# Patient Record
Sex: Female | Born: 1990 | Race: White | Hispanic: No | Marital: Married | Smoking: Never smoker
Health system: Southern US, Community
[De-identification: ages and names within clinical notes are randomized; demographics above are authoritative.]

## PROBLEM LIST (undated history)

## (undated) DIAGNOSIS — O021 Missed abortion: Secondary | ICD-10-CM

## (undated) DIAGNOSIS — Z9889 Other specified postprocedural states: Secondary | ICD-10-CM

## (undated) DIAGNOSIS — J45909 Unspecified asthma, uncomplicated: Secondary | ICD-10-CM

## (undated) HISTORY — PX: OTHER SURGICAL HISTORY: SHX169

## (undated) HISTORY — DX: Unspecified asthma, uncomplicated: J45.909

---
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
## 2014-05-17 ENCOUNTER — Emergency Department (HOSPITAL_COMMUNITY)
Admission: EM | Admit: 2014-05-17 | Discharge: 2014-05-17 | Disposition: A | Discharge status: Home / Self Care | Payer: 59 | Source: Home / Self Care | Attending: Family Medicine | Admitting: Family Medicine

## 2014-05-17 ENCOUNTER — Encounter (HOSPITAL_COMMUNITY): Payer: Self-pay | Admitting: *Deleted

## 2014-05-17 DIAGNOSIS — K529 Noninfective gastroenteritis and colitis, unspecified: Secondary | ICD-10-CM

## 2014-05-17 LAB — POCT URINALYSIS DIP (DEVICE)
BILIRUBIN URINE: NEGATIVE
GLUCOSE, UA: NEGATIVE mg/dL
Hgb urine dipstick: NEGATIVE
KETONES UR: NEGATIVE mg/dL
Leukocytes, UA: NEGATIVE
Nitrite: NEGATIVE
Protein, ur: NEGATIVE mg/dL
Specific Gravity, Urine: 1.025 (ref 1.005–1.030)
Urobilinogen, UA: 0.2 mg/dL (ref 0.0–1.0)
pH: 6 (ref 5.0–8.0)

## 2014-05-17 LAB — POCT PREGNANCY, URINE: PREG TEST UR: NEGATIVE

## 2014-05-17 MED ORDER — ONDANSETRON HCL 4 MG PO TABS: 4.0000 mg | ORAL_TABLET | Freq: Three times a day (TID) | ORAL | Status: DC | PRN

## 2014-05-17 MED ORDER — KETOROLAC TROMETHAMINE 30 MG/ML IJ SOLN
INTRAMUSCULAR | Status: AC
  Filled 2014-05-17: qty 1

## 2014-05-17 MED ORDER — KETOROLAC TROMETHAMINE 30 MG/ML IJ SOLN
30.0000 mg | Freq: Once | INTRAMUSCULAR | Status: AC
  Administered 2014-05-17: 30 mg via INTRAMUSCULAR

## 2014-05-17 MED ORDER — ONDANSETRON 4 MG PO TBDP
ORAL_TABLET | ORAL | Status: AC
  Filled 2014-05-17: qty 1

## 2014-05-17 MED ORDER — ONDANSETRON 4 MG PO TBDP
4.0000 mg | ORAL_TABLET | Freq: Once | ORAL | Status: AC
  Administered 2014-05-17: 4 mg via ORAL

## 2014-05-17 NOTE — ED Provider Notes (Signed)
CSN: [id number]     Arrival date & time 05/17/14  1306 History   First MD Initiated Contact with Patient 05/17/14 1436     Chief Complaint  Patient presents with  . Emesis   (Consider location/radiation/quality/duration/timing/severity/associated sxs/prior Treatment) HPI Comments: Woke with N/V/D, cramping abdominal pain and chills this morning. Boyfriend ill with same LNMP: 04/14/2014 O/W healthy Works in healthcare  Patient is a 23 y.o. female presenting with GI illness. The history is provided by the patient.  GI Problem This is a new problem. The current episode started 3 to 5 hours ago. Associated symptoms include abdominal pain.    History reviewed. No pertinent past medical history. History reviewed. No pertinent past surgical history. History reviewed. No pertinent family history. History  Substance Use Topics  . Smoking status: Not on file  . Smokeless tobacco: Not on file  . Alcohol Use: Yes     Comment: occasonally    OB History    No data available     Review of Systems  Constitutional: Positive for chills and appetite change.  HENT: Negative.   Respiratory: Negative.   Cardiovascular: Negative.   Gastrointestinal: Positive for nausea, vomiting, abdominal pain and diarrhea. Negative for blood in stool.  Genitourinary: Negative for dysuria, frequency, hematuria, flank pain, vaginal bleeding, vaginal discharge, vaginal pain and menstrual problem.  Musculoskeletal: Positive for back pain.  Skin: Negative.     Allergies  Review of patient's allergies indicates no known allergies.  Home Medications   Prior to Admission medications   Medication Sig Start Date End Date Taking? Authorizing Provider  ondansetron (ZOFRAN) 4 MG tablet Take 1 tablet (4 mg total) by mouth every 8 (eight) hours as needed for nausea or vomiting. 05/17/14   Mathis FareJennifer Lee H Johnny Gorter, PA   BP 111/61 mmHg  Pulse 118  Temp(Src) 98.3 F (36.8 C)  Resp 20  SpO2 100%  LMP  04/14/2014 Physical Exam  Constitutional: She is oriented to person, place, and time. She appears well-developed and well-nourished. No distress.  HENT:  Head: Normocephalic and atraumatic.  Mouth/Throat: Oropharynx is clear and moist.  Eyes: Conjunctivae are normal. No scleral icterus.  Cardiovascular: Regular rhythm and normal heart sounds.   +mild tachycardia  Pulmonary/Chest: Effort normal and breath sounds normal.  Abdominal: Soft. Normal appearance. Bowel sounds are increased. There is no tenderness. There is no rigidity, no guarding and no CVA tenderness.  Musculoskeletal: Normal range of motion.  Neurological: She is alert and oriented to person, place, and time.  Skin: Skin is warm and dry. No rash noted. No erythema.  Psychiatric: She has a normal mood and affect. Her behavior is normal.  Nursing note and vitals reviewed.   ED Course  Procedures (including critical care time) Labs Review Labs Reviewed  POCT URINALYSIS DIP (DEVICE)  POCT PREGNANCY, URINE    Imaging Review No results found.   MDM   1. Gastroenteritis   Zofran 4 mg ODT. [facility] Rx for home Oral rehydration  Tylenol or ibuprofen for pain Clears with diet advance as tolerated.  UA normal UPT negative    Ria ClockJennifer Lee H Syra Sirmons, [place] 05/17/14 1452

## 2014-05-17 NOTE — Discharge Instructions (Signed)
Food Choices to Help Relieve Diarrhea °When you have diarrhea, the foods you eat and your eating habits are very important. Choosing the right foods and drinks can help relieve diarrhea. Also, because diarrhea can last up to 7 days, you need to replace lost fluids and electrolytes (such as sodium, potassium, and chloride) in order to help prevent dehydration.  °WHAT GENERAL GUIDELINES DO I NEED TO FOLLOW? °· Slowly drink 1 cup (8 oz) of fluid for each episode of diarrhea. If you are getting enough fluid, your urine will be clear or pale yellow. °· Eat starchy foods. Some good choices include white rice, white toast, pasta, low-fiber cereal, baked potatoes (without the skin), saltine crackers, and bagels. °· Avoid large servings of any cooked vegetables. °· Limit fruit to two servings per day. A serving is ½ cup or 1 small piece. °· Choose foods with less than 2 g of fiber per serving. °· Limit fats to less than 8 tsp (38 g) per day. °· Avoid fried foods. °· Eat foods that have probiotics in them. Probiotics can be found in certain dairy products. °· Avoid foods and beverages that may increase the speed at which food moves through the stomach and intestines (gastrointestinal tract). Things to avoid include: °¨ High-fiber foods, such as dried fruit, raw fruits and vegetables, nuts, seeds, and whole grain foods. °¨ Spicy foods and high-fat foods. °¨ Foods and beverages sweetened with high-fructose corn syrup, honey, or sugar alcohols such as xylitol, sorbitol, and mannitol. °WHAT FOODS ARE RECOMMENDED? °Grains °White rice. White, French, or pita breads (fresh or toasted), including plain rolls, buns, or bagels. White pasta. Saltine, soda, or graham crackers. Pretzels. Low-fiber cereal. Cooked cereals made with water (such as cornmeal, farina, or cream cereals). Plain muffins. Matzo. Melba toast. Zwieback.  °Vegetables °Potatoes (without the skin). Strained tomato and vegetable juices. Most well-cooked and canned  vegetables without seeds. Tender lettuce. °Fruits °Cooked or canned applesauce, apricots, cherries, fruit cocktail, grapefruit, peaches, pears, or plums. Fresh bananas, apples without skin, cherries, grapes, cantaloupe, grapefruit, peaches, oranges, or plums.  °Meat and Other Protein Products °Baked or boiled chicken. Eggs. Tofu. Fish. Seafood. Smooth peanut butter. Ground or well-cooked tender beef, ham, veal, lamb, pork, or poultry.  °Dairy °Plain yogurt, kefir, and unsweetened liquid yogurt. Lactose-free milk, buttermilk, or soy milk. Plain hard cheese. °Beverages °Sport drinks. Clear broths. Diluted fruit juices (except prune). Regular, caffeine-free sodas such as ginger ale. Water. Decaffeinated teas. Oral rehydration solutions. Sugar-free beverages not sweetened with sugar alcohols. °Other °Bouillon, broth, or soups made from recommended foods.  °The items listed above may not be a complete list of recommended foods or beverages. Contact your dietitian for more options. °WHAT FOODS ARE NOT RECOMMENDED? °Grains °Whole grain, whole wheat, bran, or rye breads, rolls, pastas, crackers, and cereals. Wild or brown rice. Cereals that contain more than 2 g of fiber per serving. Corn tortillas or taco shells. Cooked or dry oatmeal. Granola. Popcorn. °Vegetables °Raw vegetables. Cabbage, broccoli, Brussels sprouts, artichokes, baked beans, beet greens, corn, kale, legumes, peas, sweet potatoes, and yams. Potato skins. Cooked spinach and cabbage. °Fruits °Dried fruit, including raisins and dates. Raw fruits. Stewed or dried prunes. Fresh apples with skin, apricots, mangoes, pears, raspberries, and strawberries.  °Meat and Other Protein Products °Chunky peanut butter. Nuts and seeds. Beans and lentils. Bacon.  °Dairy °High-fat cheeses. Milk, chocolate milk, and beverages made with milk, such as milk shakes. Cream. Ice cream. °Sweets and Desserts °Sweet rolls, doughnuts, and sweet breads. Pancakes   and waffles. °Fats and  Oils °Butter. Cream sauces. Margarine. Salad oils. Plain salad dressings. Olives. Avocados.  °Beverages °Caffeinated beverages (such as coffee, tea, soda, or energy drinks). Alcoholic beverages. Fruit juices with pulp. Prune juice. Soft drinks sweetened with high-fructose corn syrup or sugar alcohols. °Other °Coconut. Hot sauce. Chili powder. Mayonnaise. Gravy. Cream-based or milk-based soups.  °The items listed above may not be a complete list of foods and beverages to avoid. Contact your dietitian for more information. °WHAT SHOULD I DO IF I BECOME DEHYDRATED? °Diarrhea can sometimes lead to dehydration. Signs of dehydration include dark urine and dry mouth and skin. If you think you are dehydrated, you should rehydrate with an oral rehydration solution. These solutions can be purchased at pharmacies, retail stores, or online.  °Drink ½-1 cup (120-240 mL) of oral rehydration solution each time you have an episode of diarrhea. If drinking this amount makes your diarrhea worse, try drinking smaller amounts more often. For example, drink 1-3 tsp (5-15 mL) every 5-10 minutes.  °A general rule for staying hydrated is to drink 1½-2 L of fluid per day. Talk to your health care provider about the specific amount you should be drinking each day. Drink enough fluids to keep your urine clear or pale yellow. °Document Released: 09/07/2003 Document Revised: 06/22/2013 Document Reviewed: 05/10/2013 °ExitCare® Patient Information ©2015 [employer]. This information is not intended to replace advice given to you by your health care provider. Make sure you discuss any questions you have with your health care provider. °Viral Gastroenteritis °Viral gastroenteritis is also known as stomach flu. This condition affects the stomach and intestinal tract. It can cause sudden diarrhea and vomiting. The illness typically lasts 3 to 8 days. Most people develop an immune response that eventually gets rid of the virus. While this natural  response develops, the virus can make you quite ill. °CAUSES  °Many different viruses can cause gastroenteritis, such as rotavirus or noroviruses. You can catch one of these viruses by consuming contaminated food or water. You may also catch a virus by sharing utensils or other personal items with an infected person or by touching a contaminated surface. °SYMPTOMS  °The most common symptoms are diarrhea and vomiting. These problems can cause a severe loss of body fluids (dehydration) and a body salt (electrolyte) imbalance. Other symptoms may include: °· Fever. °· Headache. °· Fatigue. °· Abdominal pain. °DIAGNOSIS  °Your caregiver can usually diagnose viral gastroenteritis based on your symptoms and a physical exam. A stool sample may also be taken to test for the presence of viruses or other infections. °TREATMENT  °This illness typically goes away on its own. Treatments are aimed at rehydration. The most serious cases of viral gastroenteritis involve vomiting so severely that you are not able to keep fluids down. In these cases, fluids must be given through an intravenous line (IV). °HOME CARE INSTRUCTIONS  °· Drink enough fluids to keep your urine clear or pale yellow. Drink small amounts of fluids frequently and increase the amounts as tolerated. °· Ask your caregiver for specific rehydration instructions. °· Avoid: °¨ Foods high in sugar. °¨ Alcohol. °¨ Carbonated drinks. °¨ Tobacco. °¨ Juice. °¨ Caffeine drinks. °¨ Extremely hot or cold fluids. °¨ Fatty, greasy foods. °¨ Too much intake of anything at one time. °¨ Dairy products until 24 to 48 hours after diarrhea stops. °· You may consume probiotics. Probiotics are active cultures of beneficial bacteria. They may lessen the amount and number of diarrheal stools in adults. Probiotics   can be found in yogurt with active cultures and in supplements. °· Wash your hands well to avoid spreading the virus. °· Only take over-the-counter or prescription medicines for  pain, discomfort, or fever as directed by your caregiver. Do not give aspirin to children. Antidiarrheal medicines are not recommended. °· Ask your caregiver if you should continue to take your regular prescribed and over-the-counter medicines. °· Keep all follow-up appointments as directed by your caregiver. °SEEK IMMEDIATE MEDICAL CARE IF:  °· You are unable to keep fluids down. °· You do not urinate at least once every 6 to 8 hours. °· You develop shortness of breath. °· You notice blood in your stool or vomit. This may look like coffee grounds. °· You have abdominal pain that increases or is concentrated in one small area (localized). °· You have persistent vomiting or diarrhea. °· You have a fever. °· The patient is a child younger than 3 months, and he or she has a fever. °· The patient is a child older than 3 months, and he or she has a fever and persistent symptoms. °· The patient is a child older than 3 months, and he or she has a fever and symptoms suddenly get worse. °· The patient is a baby, and he or she has no tears when crying. °MAKE SURE YOU:  °· Understand these instructions. °· Will watch your condition. °· Will get help right away if you are not doing well or get worse. °Document Released: 06/17/2005 Document Revised: 09/09/2011 Document Reviewed: 04/03/2011 °ExitCare® Patient Information ©2015 [employer]. This information is not intended to replace advice given to you by your health care provider. Make sure you discuss any questions you have with your health care provider. ° °

## 2014-05-17 NOTE — ED Notes (Signed)
Pt  Reports  Symptoms  Of  Nausea   /  Vomiting  /  Diarrhea     -  Pt    Reports   Symptoms       Began this  Am      Pt  Also  Reports  Boyfriend  Has  Symptoms  Of  A  Bacterial  Infection      Of  Her  Gi  Track

## 2014-07-21 ENCOUNTER — Encounter: Payer: Self-pay | Admitting: Family

## 2014-07-26 ENCOUNTER — Encounter: Payer: Self-pay | Admitting: Family

## 2014-07-26 ENCOUNTER — Ambulatory Visit (INDEPENDENT_AMBULATORY_CARE_PROVIDER_SITE_OTHER): Payer: 59 | Admitting: Family

## 2014-07-26 VITALS — BP 90/60 | HR 66 | Temp 98.5°F | Ht 64.0 in | Wt 113.4 lb

## 2014-07-26 DIAGNOSIS — Z0000 Encounter for general adult medical examination without abnormal findings: Secondary | ICD-10-CM

## 2014-07-26 NOTE — Patient Instructions (Signed)

## 2014-07-26 NOTE — Progress Notes (Signed)
   Subjective:    Patient ID: Theresa Carlson, female    DOB: 12/25/1990, 24 y.o.   MRN: [id number]  HPI 24 year old white female, nonsmoker is in today for CPX and to be established. Denies any concerns. Sees GYN for female care.    Review of Systems  Constitutional: Negative.   HENT: Negative.   Eyes: Negative.   Respiratory: Negative.   Cardiovascular: Negative.   Gastrointestinal: Negative.   Endocrine: Negative.   Genitourinary: Negative.   Musculoskeletal: Negative.   Skin: Negative.   Allergic/Immunologic: Negative.   Neurological: Negative.   Hematological: Negative.   Psychiatric/Behavioral: Negative.    Past Medical History  Diagnosis Date  . Asthma     History   Social History  . Marital Status: Single    Spouse Name: N/A    Number of Children: N/A  . Years of Education: N/A   Occupational History  . Not on file.   Social History Main Topics  . Smoking status: Never Smoker   . Smokeless tobacco: Not on file  . Alcohol Use: 0.0 oz/week    0 Not specified per week     Comment: occasonally   . Drug Use: No  . Sexual Activity: Not on file   Other Topics Concern  . Not on file   Social History Narrative    History reviewed. No pertinent past surgical history.  Family History  Problem Relation Age of Onset  . Hypertension Mother   . Hypertension Father   . Stroke Maternal Grandfather   . Prostate cancer Maternal Grandfather   . Breast cancer Paternal Grandmother   . Lung cancer Paternal Grandfather     No Known Allergies  No current outpatient prescriptions on file prior to visit.   No current facility-administered medications on file prior to visit.    BP 90/60 mmHg  Pulse 66  Temp(Src) 98.5 F (36.9 C) (Oral)  Ht 5\' 4"  (1.626 m)  Wt 113 lb 6.4 oz (51.438 kg)  BMI 19.46 kg/m2chart    Objective:   Physical Exam  Constitutional: She is oriented to person, place, and time. She appears well-developed and well-nourished.  HENT:    Head: Normocephalic.  Right Ear: External ear normal.  Left Ear: External ear normal.  Nose: Nose normal.  Mouth/Throat: Oropharynx is clear and moist.  Eyes: Conjunctivae and EOM are normal. Pupils are equal, round, and reactive to light.  Neck: Normal range of motion. Neck supple. No thyromegaly present.  Cardiovascular: Normal rate, regular rhythm and normal heart sounds.   Pulmonary/Chest: Effort normal and breath sounds normal.  Abdominal: Soft. Bowel sounds are normal.  Musculoskeletal: Normal range of motion. She exhibits no edema or tenderness.  Neurological: She is alert and oriented to person, place, and time. She has normal reflexes.  Skin: Skin is warm and dry.  Psychiatric: She has a normal mood and affect.          Assessment & Plan:  Theresa Carlson was seen today for establish care.  Diagnoses and associated orders for this visit:  Preventative health care - Lipid Panel; Future - CBC with Differential; Future - CMP; Future - POCT urinalysis dipstick - TSH; Future    Encouraged healthy diet and exercise. Monthly self breast exams. Follow with gynecology as scheduled.

## 2014-07-26 NOTE — Progress Notes (Signed)
Pre visit review using our clinic review tool, if applicable. No additional management support is needed unless otherwise documented below in the visit note. 

## 2014-07-28 ENCOUNTER — Other Ambulatory Visit (INDEPENDENT_AMBULATORY_CARE_PROVIDER_SITE_OTHER): Payer: 59

## 2014-07-28 DIAGNOSIS — Z0000 Encounter for general adult medical examination without abnormal findings: Secondary | ICD-10-CM

## 2014-07-28 LAB — CBC WITH DIFFERENTIAL/PLATELET
Basophils Absolute: 0 10*3/uL (ref 0.0–0.1)
Basophils Relative: 0.5 % (ref 0.0–3.0)
EOS ABS: 0.2 10*3/uL (ref 0.0–0.7)
EOS PCT: 3.4 % (ref 0.0–5.0)
HCT: 40.6 % (ref 36.0–46.0)
Hemoglobin: 13.9 g/dL (ref 12.0–15.0)
Lymphocytes Relative: 47.5 % — ABNORMAL HIGH (ref 12.0–46.0)
Lymphs Abs: 2.9 10*3/uL (ref 0.7–4.0)
MCHC: 34.2 g/dL (ref 30.0–36.0)
MCV: 87.3 fl (ref 78.0–100.0)
MONOS PCT: 7.9 % (ref 3.0–12.0)
Monocytes Absolute: 0.5 10*3/uL (ref 0.1–1.0)
NEUTROS PCT: 40.7 % — AB (ref 43.0–77.0)
Neutro Abs: 2.5 10*3/uL (ref 1.4–7.7)
PLATELETS: 206 10*3/uL (ref 150.0–400.0)
RBC: 4.65 Mil/uL (ref 3.87–5.11)
RDW: 12.6 % (ref 11.5–15.5)
WBC: 6.1 10*3/uL (ref 4.0–10.5)

## 2014-07-28 LAB — COMPREHENSIVE METABOLIC PANEL
ALT: 16 U/L (ref 0–35)
AST: 22 U/L (ref 0–37)
Albumin: 4.4 g/dL (ref 3.5–5.2)
Alkaline Phosphatase: 52 U/L (ref 39–117)
BILIRUBIN TOTAL: 0.4 mg/dL (ref 0.2–1.2)
BUN: 15 mg/dL (ref 6–23)
CO2: 24 mEq/L (ref 19–32)
Calcium: 9.8 mg/dL (ref 8.4–10.5)
Chloride: 105 mEq/L (ref 96–112)
Creatinine, Ser: 0.68 mg/dL (ref 0.40–1.20)
GFR: 114.15 mL/min (ref >60.00)
Glucose, Bld: 82 mg/dL (ref 70–99)
Potassium: 4 mEq/L (ref 3.5–5.1)
Sodium: 141 mEq/L (ref 135–145)
TOTAL PROTEIN: 7.2 g/dL (ref 6.0–8.3)

## 2014-07-28 LAB — TSH: TSH: 2.26 u[IU]/mL (ref 0.35–4.50)

## 2014-07-28 LAB — LIPID PANEL
CHOL/HDL RATIO: 2
Cholesterol: 173 mg/dL (ref 0–200)
HDL: 85.6 mg/dL (ref >39.00)
LDL Cholesterol: 75 mg/dL (ref 0–99)
NONHDL: 87.4
Triglycerides: 62 mg/dL (ref 0.0–149.0)
VLDL: 12.4 mg/dL (ref 0.0–40.0)

## 2015-04-12 ENCOUNTER — Ambulatory Visit (INDEPENDENT_AMBULATORY_CARE_PROVIDER_SITE_OTHER): Payer: 59 | Admitting: Allergy and Immunology

## 2015-04-12 ENCOUNTER — Encounter: Payer: Self-pay | Admitting: Allergy and Immunology

## 2015-04-12 VITALS — BP 118/82 | HR 68 | Temp 98.3°F | Resp 18 | Ht 64.76 in | Wt 117.7 lb

## 2015-04-12 DIAGNOSIS — J4520 Mild intermittent asthma, uncomplicated: Secondary | ICD-10-CM | POA: Diagnosis not present

## 2015-04-12 DIAGNOSIS — J3089 Other allergic rhinitis: Secondary | ICD-10-CM

## 2015-04-12 NOTE — Patient Instructions (Addendum)
  1. Allergen avoidance measures  2. Treat and prevent inflammation: OTC Rhinocort - one spray each nostril 3-7 times per week. Takes days to work  3. If needed;   A. Nasal saline  B. otc antihistamine - decongestant  C. ProAir HFA 2 puffs every 4-6 hours. Can use prior to exercise.  4. If medical failure can consider immunotherapy  5. Contact the clinic if problems.

## 2015-04-12 NOTE — Progress Notes (Signed)
[facility]    NEW PATIENT NOTE    Subjective:   Patient ID: Theresa Carlson is a 24 y.o. female with a chief complaint of New Patient (Initial Visit) and Allergy Testing  and the following problems:  HPI Comments:  Theresa Carlson present today with complaints of long standing rhinitis with occasional eye symptoms that occurs on a random and perennial basis without obvious trigger except for cat exposure, that Theresa Carlson treats with Zyrtec-D with relatively good results. Theresa Carlson also had a history of childhood asthma that resolved during elementary school. However, on  Theresa Carlson has not used a SABA in a prolonged period of timea rare occasion Theresa Carlson will develop some wheezing. This appears to occur with exercising in the cold.   Past Medical History  Diagnosis Date  . Asthma     as a child    Past Surgical History  Procedure Laterality Date  . Trigger thumb       Current outpatient prescriptions:  .  Norgestimate-Eth Estradiol (PREVIFEM PO), Take 1 tablet by mouth., Disp: , Rfl:   No orders of the defined types were placed in this encounter.    No Known Allergies  Review of Systems  Constitutional: Negative.   HENT: Positive for congestion.   Eyes: Negative.   Respiratory: Negative.   Cardiovascular: Negative.   Gastrointestinal: Negative.   Musculoskeletal: Negative.   Skin: Negative.     Family History  Problem Relation Age of Onset  . Hypertension Mother   . Eczema Mother   . Hypertension Father   . Stroke Maternal Grandfather   . Prostate cancer Maternal Grandfather   . Breast cancer Paternal Grandmother   . Lung cancer Paternal Grandfather     Social History   Social History  . Marital Status: Single    Spouse Name: N/A  . Number of Children: N/A  . Years of Education: N/A   Occupational History  . Not on file.   Social History Main Topics  . Smoking status: Never Smoker   . Smokeless tobacco: Not on file  .  Alcohol Use: 0.6 oz/week    0 Standard drinks or equivalent, 1 Glasses of wine per week     Comment: occasonally   . Drug Use: No  . Sexual Activity: Not on file   Other Topics Concern  . Not on file   Social History Narrative    Environmental and Social history Lives in a apartment with a dry environment. There are dogs located in the bedroom. There are dogs located in the house. The bedroom floor is carpet. There is plastic covers on the bedding. There no active smoking within the house. There no active smoking within the car. Employment Product managerincludes nurse. Hobbies include hospital chemical exposure.   Objective:   Filed Vitals:   04/12/15 0840  BP: 118/82  Pulse: 68  Temp: 98.3 F (36.8 C)  Resp: 18    Physical Exam  Constitutional: Theresa Carlson is well-developed, well-nourished, and in no distress. No distress.  HENT:  Head: Normocephalic.  Right Ear: External ear normal.  Left Ear: External ear normal.  Nose: Nose normal.  Mouth/Throat: Oropharynx is clear and moist. No oropharyngeal exudate.  Eyes: Conjunctivae are normal. Pupils are equal, round, and reactive to light. Right eye exhibits no discharge. Left eye exhibits no discharge. No scleral icterus.  Neck: No tracheal deviation present. No thyromegaly present.  Cardiovascular: Normal rate, regular rhythm and normal heart sounds.  Exam reveals no gallop and no friction rub.   No murmur heard. Pulmonary/Chest: Effort normal and breath sounds normal. No respiratory distress. Theresa Carlson has no wheezes. Theresa Carlson has no rales. Theresa Carlson exhibits no tenderness.  Abdominal: Soft. Theresa Carlson exhibits no distension and no mass. There is no tenderness. There is no rebound and no guarding.  Lymphadenopathy:    Theresa Carlson has no cervical adenopathy.  Neurological: Theresa Carlson is alert. Gait normal.  Skin: No rash noted. Theresa Carlson is not diaphoretic. No erythema. No pallor.  Psychiatric: Mood and affect normal.    Diagnostics:  Allergy skin tests were performed. Epicutaneous  positives included cat, dog, horse, mouse. Intradermal positives included molds  Spirometry was performed and demonstrated an FEV1 of 2.91 @ 90 % of predicted.  The patient had an Asthma Control Test with the following results: ACT Total Score: 24.     Assessment and Plan:    1. Other allergic rhinitis   2. Mild intermittent asthma, uncomplicated        1. Allergen avoidance measures  2. Treat and prevent inflammation: OTC Rhinocort - one spray each nostril 3-7 times per week. Takes days to work  3. If needed;   A. Nasal saline  B. otc antihistamine - decongestant  C. ProAir HFA 2 puffs every 4-6 hours. Can use prior to exercise.  4. If medical failure can consider immunotherapy  5. Contact the clinic if problems.  Theresa Carlson should do well with a combination of allergen avoidance measures and intermittent use of anti-inflammatory agents. If Theresa Carlson fails medical therapy Theresa Carlson would be a candidate for immunotherapy. Her asthma is a minimal issue although i did discuss with her today that Theresa Carlson is at risk of developing a persistent phenotype in the future. At this point Theresa Carlson asked that we do not send a prescription for a SABA     Laurette Schimke, MD [facility]

## 2015-04-18 ENCOUNTER — Encounter: Payer: Self-pay | Admitting: Allergy and Immunology

## 2015-06-23 ENCOUNTER — Encounter: Payer: Self-pay | Admitting: Internal Medicine

## 2015-06-23 ENCOUNTER — Ambulatory Visit (INDEPENDENT_AMBULATORY_CARE_PROVIDER_SITE_OTHER): Payer: 59 | Admitting: Internal Medicine

## 2015-06-23 VITALS — BP 118/78 | HR 69 | Temp 98.3°F | Wt 114.0 lb

## 2015-06-23 DIAGNOSIS — J020 Streptococcal pharyngitis: Secondary | ICD-10-CM

## 2015-06-23 DIAGNOSIS — J069 Acute upper respiratory infection, unspecified: Secondary | ICD-10-CM | POA: Diagnosis not present

## 2015-06-23 LAB — POCT RAPID STREP A (OFFICE): Rapid Strep A Screen: NEGATIVE

## 2015-06-23 MED ORDER — AZITHROMYCIN 250 MG PO TABS: ORAL_TABLET | ORAL | Status: DC

## 2015-06-23 NOTE — Progress Notes (Signed)
Subjective:  Patient ID: Theresa Carlson, female    DOB: 01/19/1991  Age: 24 y.o. MRN: [id number]  CC: No chief complaint on file.   HPI Theresa Carlson presents for ST x 5 d R>L, URI sx's  Outpatient Prescriptions Prior to Visit  Medication Sig Dispense Refill  . Norgestimate-Eth Estradiol (PREVIFEM PO) Take 1 tablet by mouth.     No facility-administered medications prior to visit.    ROS Review of Systems  Constitutional: Negative for chills, activity change, appetite change, fatigue and unexpected weight change.  HENT: Positive for congestion and sore throat. Negative for mouth sores and sinus pressure.   Eyes: Negative for visual disturbance.  Respiratory: Positive for cough. Negative for chest tightness.   Gastrointestinal: Negative for nausea and abdominal pain.  Genitourinary: Negative for frequency, difficulty urinating and vaginal pain.  Musculoskeletal: Negative for back pain and gait problem.  Skin: Negative for pallor and rash.  Neurological: Negative for dizziness, tremors, weakness, numbness and headaches.  Psychiatric/Behavioral: Negative for confusion and sleep disturbance.    Objective:  BP 118/78 mmHg  Pulse 69  Temp(Src) 98.3 F (36.8 C) (Oral)  Wt 114 lb (51.71 kg)  SpO2 97%  BP Readings from Last 3 Encounters:  06/23/15 118/78  04/12/15 118/82  07/26/14 90/60    Wt Readings from Last 3 Encounters:  06/23/15 114 lb (51.71 kg)  04/12/15 117 lb 11.6 oz (53.4 kg)  07/26/14 113 lb 6.4 oz (51.438 kg)    Physical Exam  Constitutional: She appears well-developed. No distress.  HENT:  Head: Normocephalic.  Right Ear: External ear normal.  Left Ear: External ear normal.  Nose: Nose normal.  Mouth/Throat: Oropharynx is clear and moist.  Eyes: Conjunctivae are normal. Pupils are equal, round, and reactive to light. Right eye exhibits no discharge. Left eye exhibits no discharge.  Neck: Normal range of motion. Neck supple. No JVD present. No  tracheal deviation present. No thyromegaly present.  Cardiovascular: Normal rate, regular rhythm and normal heart sounds.   Pulmonary/Chest: No stridor. No respiratory distress. She has no wheezes.  Abdominal: Soft. Bowel sounds are normal. She exhibits no distension and no mass. There is no tenderness. There is no rebound and no guarding.  Musculoskeletal: She exhibits no edema or tenderness.  Lymphadenopathy:    She has no cervical adenopathy.  Neurological: She displays normal reflexes. No cranial nerve deficit. She exhibits normal muscle tone. Coordination normal.  Skin: No rash noted. No erythema.  Psychiatric: She has a normal mood and affect. Her behavior is normal. Judgment and thought content normal.  eryth throat R tonsil is bigger than L  Lab Results  Component Value Date   WBC 6.1 07/28/2014   HGB 13.9 07/28/2014   HCT 40.6 07/28/2014   PLT 206.0 07/28/2014   GLUCOSE 82 07/28/2014   CHOL 173 07/28/2014   TRIG 62.0 07/28/2014   HDL 85.60 07/28/2014   LDLCALC 75 07/28/2014   ALT 16 07/28/2014   AST 22 07/28/2014   NA 141 07/28/2014   K 4.0 07/28/2014   CL 105 07/28/2014   CREATININE 0.68 07/28/2014   BUN 15 07/28/2014   CO2 24 07/28/2014   TSH 2.26 07/28/2014    No results found.  Assessment & Plan:   Diagnoses and all orders for this visit:  Acute URI  Other orders -     azithromycin (ZITHROMAX) 250 MG tablet; As directed  I am having [name] start on azithromycin. I am also having her maintain  her Norgestimate-Eth Estradiol (PREVIFEM PO) and budesonide.  Meds ordered this encounter  Medications  . budesonide (RHINOCORT AQUA) 32 MCG/ACT nasal spray    Sig: Place 1 spray into both nostrils 3 (three) times a week.  Marland Kitchen azithromycin (ZITHROMAX) 250 MG tablet    Sig: As directed    Dispense:  6 tablet    Refill:  0     Follow-up: No Follow-up on file.  Sonda Primes, MD

## 2015-06-23 NOTE — Assessment & Plan Note (Signed)
12/16 pharyngitis Strep test Zpac if worse

## 2015-06-23 NOTE — Progress Notes (Signed)
Pre visit review using our clinic review tool, if applicable. No additional management support is needed unless otherwise documented below in the visit note. 

## 2015-06-23 NOTE — Patient Instructions (Signed)
Use over-the-counter  "cold" medicines  such as "Tylenol cold" , "Advil cold",  "Mucinex" or" Mucinex D"  for cough and congestion.   Avoid decongestants if you have high blood pressure and use "Afrin" nasal spray for nasal congestion as directed instead. Use" Delsym" or" Robitussin" cough syrup varietis for cough.  You can use plain "Tylenol" or "Advil" for fever, chills and achyness. Use Halls or Ricola cough drops.   "Common cold" symptoms are usually triggered by a virus.  The antibiotics are usually not necessary. On average, a" viral cold" illness would take 4-7 days to resolve.

## 2015-08-10 DIAGNOSIS — Z681 Body mass index (BMI) 19 or less, adult: Secondary | ICD-10-CM | POA: Diagnosis not present

## 2015-08-10 DIAGNOSIS — Z01419 Encounter for gynecological examination (general) (routine) without abnormal findings: Secondary | ICD-10-CM | POA: Diagnosis not present

## 2016-02-02 ENCOUNTER — Telehealth: Payer: 59 | Admitting: Nurse Practitioner

## 2016-02-02 DIAGNOSIS — J069 Acute upper respiratory infection, unspecified: Secondary | ICD-10-CM | POA: Diagnosis not present

## 2016-02-02 MED ORDER — AZITHROMYCIN 250 MG PO TABS: ORAL_TABLET | ORAL | 0 refills | Status: DC

## 2016-02-02 NOTE — Progress Notes (Signed)

## 2016-02-02 NOTE — Addendum Note (Signed)
Addended by: Jackquline Berlin on: 02/02/2016 03:01 PM   Modules accepted: Orders

## 2016-05-07 ENCOUNTER — Encounter: Payer: Self-pay | Admitting: Internal Medicine

## 2016-05-07 ENCOUNTER — Ambulatory Visit (INDEPENDENT_AMBULATORY_CARE_PROVIDER_SITE_OTHER): Payer: 59 | Admitting: Internal Medicine

## 2016-05-07 ENCOUNTER — Other Ambulatory Visit (INDEPENDENT_AMBULATORY_CARE_PROVIDER_SITE_OTHER): Payer: 59

## 2016-05-07 VITALS — BP 112/78 | HR 67 | Temp 98.5°F | Ht 65.0 in | Wt 118.0 lb

## 2016-05-07 DIAGNOSIS — Z0000 Encounter for general adult medical examination without abnormal findings: Secondary | ICD-10-CM

## 2016-05-07 DIAGNOSIS — J069 Acute upper respiratory infection, unspecified: Secondary | ICD-10-CM

## 2016-05-07 LAB — CBC WITH DIFFERENTIAL/PLATELET
BASOS ABS: 0 10*3/uL (ref 0.0–0.1)
Basophils Relative: 0.7 % (ref 0.0–3.0)
EOS ABS: 0.5 10*3/uL (ref 0.0–0.7)
Eosinophils Relative: 8.4 % — ABNORMAL HIGH (ref 0.0–5.0)
HCT: 42 % (ref 36.0–46.0)
Hemoglobin: 14.4 g/dL (ref 12.0–15.0)
LYMPHS ABS: 2.3 10*3/uL (ref 0.7–4.0)
Lymphocytes Relative: 40.2 % (ref 12.0–46.0)
MCHC: 34.2 g/dL (ref 30.0–36.0)
MCV: 86.8 fl (ref 78.0–100.0)
MONO ABS: 0.5 10*3/uL (ref 0.1–1.0)
MONOS PCT: 8.6 % (ref 3.0–12.0)
NEUTROS PCT: 42.1 % — AB (ref 43.0–77.0)
Neutro Abs: 2.4 10*3/uL (ref 1.4–7.7)
Platelets: 215 10*3/uL (ref 150.0–400.0)
RBC: 4.84 Mil/uL (ref 3.87–5.11)
RDW: 12.3 % (ref 11.5–15.5)
WBC: 5.7 10*3/uL (ref 4.0–10.5)

## 2016-05-07 LAB — URINALYSIS
BILIRUBIN URINE: NEGATIVE
Hgb urine dipstick: NEGATIVE
KETONES UR: NEGATIVE
Leukocytes, UA: NEGATIVE
Nitrite: NEGATIVE
PH: 6.5 (ref 5.0–8.0)
TOTAL PROTEIN, URINE-UPE24: NEGATIVE
Urine Glucose: NEGATIVE
Urobilinogen, UA: 0.2 (ref 0.0–1.0)

## 2016-05-07 LAB — BASIC METABOLIC PANEL
BUN: 12 mg/dL (ref 6–23)
CALCIUM: 9.6 mg/dL (ref 8.4–10.5)
CO2: 29 mEq/L (ref 19–32)
Chloride: 104 mEq/L (ref 96–112)
Creatinine, Ser: 0.62 mg/dL (ref 0.40–1.20)
GFR: 125.07 mL/min (ref >60.00)
GLUCOSE: 90 mg/dL (ref 70–99)
Potassium: 3.9 mEq/L (ref 3.5–5.1)
SODIUM: 138 meq/L (ref 135–145)

## 2016-05-07 LAB — LIPID PANEL
CHOL/HDL RATIO: 2
Cholesterol: 178 mg/dL (ref 0–200)
HDL: 83.3 mg/dL (ref >39.00)
LDL CALC: 77 mg/dL (ref 0–99)
NONHDL: 94.22
Triglycerides: 88 mg/dL (ref 0.0–149.0)
VLDL: 17.6 mg/dL (ref 0.0–40.0)

## 2016-05-07 LAB — HEPATIC FUNCTION PANEL
ALK PHOS: 41 U/L (ref 39–117)
ALT: 12 U/L (ref 0–35)
AST: 17 U/L (ref 0–37)
Albumin: 3.9 g/dL (ref 3.5–5.2)
BILIRUBIN DIRECT: 0.2 mg/dL (ref 0.0–0.3)
BILIRUBIN TOTAL: 0.4 mg/dL (ref 0.2–1.2)
Total Protein: 7 g/dL (ref 6.0–8.3)

## 2016-05-07 LAB — TSH: TSH: 0.93 u[IU]/mL (ref 0.35–4.50)

## 2016-05-07 MED ORDER — PSEUDOEPHEDRINE HCL ER 120 MG PO TB12: 120.0000 mg | ORAL_TABLET | Freq: Two times a day (BID) | ORAL | 1 refills | Status: DC | PRN

## 2016-05-07 MED ORDER — BENZONATATE 200 MG PO CAPS: 200.0000 mg | ORAL_CAPSULE | Freq: Three times a day (TID) | ORAL | 0 refills | Status: DC | PRN

## 2016-05-07 MED ORDER — VITAMIN D3 50 MCG (2000 UT) PO CAPS: 2000.0000 [IU] | ORAL_CAPSULE | Freq: Every day | ORAL | 3 refills | Status: DC

## 2016-05-07 MED ORDER — CEFDINIR 300 MG PO CAPS: 300.0000 mg | ORAL_CAPSULE | Freq: Two times a day (BID) | ORAL | 0 refills | Status: DC

## 2016-05-07 NOTE — Progress Notes (Signed)
Pre visit review using our clinic review tool, if applicable. No additional management support is needed unless otherwise documented below in the visit note. 

## 2016-05-07 NOTE — Progress Notes (Signed)
Subjective:  Patient ID: Theresa Carlson, female    DOB: 03/10/1991  Age: 25 y.o. MRN: [id number]  CC: Annual Exam   Theresa Carlson presents for a well exam C/o URI sx's x 2 weeks - green d/c  Outpatient Medications Prior to Visit  Medication Sig Dispense Refill  . budesonide (RHINOCORT AQUA) 32 MCG/ACT nasal spray Place 1 spray into both nostrils 3 (three) times a week.    Suzzanne Cloud. Norgestimate-Eth Estradiol (PREVIFEM PO) Take 1 tablet by mouth.    Marland Kitchen. azithromycin (ZITHROMAX) 250 MG tablet As directed 6 tablet 0  . azithromycin (ZITHROMAX) 250 MG tablet Take as directed. 6 tablet 0   No facility-administered medications prior to visit.     ROS Review of Systems  Constitutional: Negative for activity change, appetite change, chills, fatigue and unexpected weight change.  HENT: Positive for hearing loss, postnasal drip, rhinorrhea and sinus pain. Negative for congestion, ear discharge, mouth sores and sinus pressure.   Eyes: Negative for visual disturbance.  Respiratory: Positive for cough. Negative for chest tightness.   Gastrointestinal: Negative for abdominal pain and nausea.  Genitourinary: Negative for difficulty urinating, frequency and vaginal pain.  Musculoskeletal: Negative for back pain and gait problem.  Skin: Negative for pallor and rash.  Neurological: Negative for dizziness, tremors, weakness, numbness and headaches.  Psychiatric/Behavioral: Negative for confusion and sleep disturbance.    Objective:  BP 112/78   Pulse 67   Temp 98.5 F (36.9 C) (Oral)   Ht 5\' 5"  (1.651 m)   Wt 118 lb (53.5 kg)   SpO2 98%   BMI 19.64 kg/m   BP Readings from Last 3 Encounters:  05/07/16 112/78  06/23/15 118/78  04/12/15 118/82    Wt Readings from Last 3 Encounters:  05/07/16 118 lb (53.5 kg)  06/23/15 114 lb (51.7 kg)  04/12/15 117 lb 11.6 oz (53.4 kg)    Physical Exam  Constitutional: She appears well-developed. No distress.  HENT:  Head: Normocephalic.    Mouth/Throat: No oropharyngeal exudate.  Eyes: Conjunctivae are normal. Pupils are equal, round, and reactive to light. Right eye exhibits no discharge. Left eye exhibits no discharge.  Neck: Normal range of motion. Neck supple. No JVD present. No tracheal deviation present. No thyromegaly present.  Cardiovascular: Normal rate, regular rhythm and normal heart sounds.   Pulmonary/Chest: No stridor. No respiratory distress. She has no wheezes.  Abdominal: Soft. Bowel sounds are normal. She exhibits no distension and no mass. There is no tenderness. There is no rebound and no guarding.  Musculoskeletal: She exhibits no edema or tenderness.  Lymphadenopathy:    She has no cervical adenopathy.  Neurological: She displays normal reflexes. No cranial nerve deficit. She exhibits normal muscle tone. Coordination normal.  Skin: No rash noted. No erythema.  Psychiatric: She has a normal mood and affect. Her behavior is normal. Judgment and thought content normal.  eryth throat, nares  Lab Results  Component Value Date   WBC 6.1 07/28/2014   HGB 13.9 07/28/2014   HCT 40.6 07/28/2014   PLT 206.0 07/28/2014   GLUCOSE 82 07/28/2014   CHOL 173 07/28/2014   TRIG 62.0 07/28/2014   HDL 85.60 07/28/2014   LDLCALC 75 07/28/2014   ALT 16 07/28/2014   AST 22 07/28/2014   NA 141 07/28/2014   K 4.0 07/28/2014   CL 105 07/28/2014   CREATININE 0.68 07/28/2014   BUN 15 07/28/2014   CO2 24 07/28/2014   TSH 2.26 07/28/2014  No results found.  Assessment & Plan:   There are no diagnoses linked to this encounter. I have discontinued [name]'s azithromycin and azithromycin. I am also having her maintain her Norgestimate-Eth Estradiol (PREVIFEM PO) and budesonide.  No orders of the defined types were placed in this encounter.    Follow-up: No Follow-up on file.  Sonda PrimesAlex Plotnikov, MD

## 2016-05-07 NOTE — Assessment & Plan Note (Signed)
Sinusitis/bronchitis Tessalon, Sudafed prn Cefdinir x 10 d

## 2016-05-07 NOTE — Assessment & Plan Note (Signed)
We discussed age appropriate health related issues, including available/recomended screening tests and vaccinations. We discussed a need for adhering to healthy diet and exercise. Labs/EKG were reviewed/ordered. All questions were answered.  GYN q 12 mo Labs

## 2016-05-07 NOTE — Patient Instructions (Signed)
Use over-the-counter  "cold" medicines  such as "Tylenol cold" , "Advil cold",  "Mucinex" or" Mucinex D"  for cough and congestion.   Avoid decongestants if you have high blood pressure and use "Afrin" nasal spray for nasal congestion as directed instead. Use" Delsym" or" Robitussin" cough syrup varietis for cough.  You can use plain "Tylenol" or "Advil" for fever, chills and achyness. Use Halls or Ricola cough drops.    

## 2016-05-13 ENCOUNTER — Telehealth: Payer: 59 | Admitting: Family

## 2016-05-13 DIAGNOSIS — B3731 Acute candidiasis of vulva and vagina: Secondary | ICD-10-CM

## 2016-05-13 DIAGNOSIS — B373 Candidiasis of vulva and vagina: Secondary | ICD-10-CM | POA: Diagnosis not present

## 2016-05-13 MED ORDER — FLUCONAZOLE 150 MG PO TABS: 150.0000 mg | ORAL_TABLET | Freq: Once | ORAL | 1 refills | Status: AC

## 2016-05-13 NOTE — Progress Notes (Signed)

## 2016-06-04 ENCOUNTER — Telehealth: Payer: 59 | Admitting: Family

## 2016-06-04 DIAGNOSIS — J028 Acute pharyngitis due to other specified organisms: Secondary | ICD-10-CM

## 2016-06-04 DIAGNOSIS — B9689 Other specified bacterial agents as the cause of diseases classified elsewhere: Secondary | ICD-10-CM | POA: Diagnosis not present

## 2016-06-04 MED ORDER — BENZONATATE 100 MG PO CAPS: 100.0000 mg | ORAL_CAPSULE | Freq: Three times a day (TID) | ORAL | 0 refills | Status: DC | PRN

## 2016-06-04 MED ORDER — AZITHROMYCIN 250 MG PO TABS: ORAL_TABLET | ORAL | 0 refills | Status: DC

## 2016-06-04 MED ORDER — PREDNISONE 5 MG PO TABS: 5.0000 mg | ORAL_TABLET | ORAL | 0 refills | Status: DC

## 2016-06-04 NOTE — Progress Notes (Signed)
We are sorry that you are not feeling well.  Here is how we plan to help!  Based on what you have shared with me it looks like you have upper respiratory tract inflammation that has resulted in a significant cough.  Inflammation and infection in the upper respiratory tract is commonly called bronchitis and has four common causes:  Allergies, Viral Infections, Acid Reflux and Bacterial Infections.  Allergies, viruses and acid reflux are treated by controlling symptoms or eliminating the cause. An example might be a cough caused by taking certain blood pressure medications. You stop the cough by changing the medication. Another example might be a cough caused by acid reflux. Controlling the reflux helps control the cough.  Based on your presentation I believe you most likely have A cough due to bacteria.  When patients have a fever and a productive cough with a change in color or increased sputum production, we are concerned about bacterial bronchitis.  If left untreated it can progress to pneumonia.  If your symptoms do not improve with your treatment plan it is important that you contact your provider.   I have prescribed Azithromyin 250 mg: two tables now and then one tablet daily for 4 additonal days    In addition you may use A non-prescription cough medication called Mucinex DM: take 2 tablets every 12 hours. and A prescription cough medication called Tessalon Perles 100mg. You may take 1-2 capsules every 8 hours as needed for your cough.  Sterapred 5 mg dosepak  USE OF BRONCHODILATOR ("RESCUE") INHALERS: There is a risk from using your bronchodilator too frequently.  The risk is that over-reliance on a medication which only relaxes the muscles surrounding the breathing tubes can reduce the effectiveness of medications prescribed to reduce swelling and congestion of the tubes themselves.  Although you feel brief relief from the bronchodilator inhaler, your asthma may actually be worsening with the  tubes becoming more swollen and filled with mucus.  This can delay other crucial treatments, such as oral steroid medications. If you need to use a bronchodilator inhaler daily, several times per day, you should discuss this with your provider.  There are probably better treatments that could be used to keep your asthma under control.     HOME CARE . Only take medications as instructed by your medical team. . Complete the entire course of an antibiotic. . Drink plenty of fluids and get plenty of rest. . Avoid close contacts especially the very young and the elderly . Cover your mouth if you cough or cough into your sleeve. . Always remember to wash your hands . A steam or ultrasonic humidifier can help congestion.   GET HELP RIGHT AWAY IF: . You develop worsening fever. . You become short of breath . You cough up blood. . Your symptoms persist after you have completed your treatment plan MAKE SURE YOU   Understand these instructions.  Will watch your condition.  Will get help right away if you are not doing well or get worse.  Your e-visit answers were reviewed by a board certified advanced clinical practitioner to complete your personal care plan.  Depending on the condition, your plan could have included both over the counter or prescription medications. If there is a problem please reply  once you have received a response from your provider. Your safety is important to us.  If you have drug allergies check your prescription carefully.    You can use MyChart to ask questions about today's   visit, request a non-urgent call back, or ask for a work or school excuse for 24 hours related to this e-Visit. If it has been greater than 24 hours you will need to follow up with your provider, or enter a new e-Visit to address those concerns. You will get an e-mail in the next two days asking about your experience.  I hope that your e-visit has been valuable and will speed your recovery. Thank you  for using e-visits.   

## 2016-08-12 DIAGNOSIS — Z681 Body mass index (BMI) 19 or less, adult: Secondary | ICD-10-CM | POA: Diagnosis not present

## 2016-08-12 DIAGNOSIS — Z01419 Encounter for gynecological examination (general) (routine) without abnormal findings: Secondary | ICD-10-CM | POA: Diagnosis not present

## 2017-04-28 ENCOUNTER — Encounter: Payer: Self-pay | Admitting: Internal Medicine

## 2017-04-28 ENCOUNTER — Ambulatory Visit (INDEPENDENT_AMBULATORY_CARE_PROVIDER_SITE_OTHER): Payer: 59 | Admitting: Internal Medicine

## 2017-04-28 ENCOUNTER — Other Ambulatory Visit (INDEPENDENT_AMBULATORY_CARE_PROVIDER_SITE_OTHER): Payer: 59

## 2017-04-28 DIAGNOSIS — Z0000 Encounter for general adult medical examination without abnormal findings: Secondary | ICD-10-CM

## 2017-04-28 LAB — BASIC METABOLIC PANEL
BUN: 14 mg/dL (ref 6–23)
CALCIUM: 9.4 mg/dL (ref 8.4–10.5)
CO2: 29 meq/L (ref 19–32)
CREATININE: 0.6 mg/dL (ref 0.40–1.20)
Chloride: 102 mEq/L (ref 96–112)
GFR: 128.87 mL/min (ref >60.00)
Glucose, Bld: 89 mg/dL (ref 70–99)
Potassium: 4 mEq/L (ref 3.5–5.1)
Sodium: 138 mEq/L (ref 135–145)

## 2017-04-28 LAB — CBC WITH DIFFERENTIAL/PLATELET
Basophils Absolute: 0.1 10*3/uL (ref 0.0–0.1)
Basophils Relative: 1.4 % (ref 0.0–3.0)
Eosinophils Absolute: 0.3 10*3/uL (ref 0.0–0.7)
Eosinophils Relative: 8 % — ABNORMAL HIGH (ref 0.0–5.0)
HCT: 44.6 % (ref 36.0–46.0)
Hemoglobin: 15.1 g/dL — ABNORMAL HIGH (ref 12.0–15.0)
LYMPHS ABS: 1.8 10*3/uL (ref 0.7–4.0)
Lymphocytes Relative: 42.7 % (ref 12.0–46.0)
MCHC: 33.8 g/dL (ref 30.0–36.0)
MCV: 91.7 fl (ref 78.0–100.0)
Monocytes Absolute: 0.4 10*3/uL (ref 0.1–1.0)
Monocytes Relative: 9 % (ref 3.0–12.0)
NEUTROS ABS: 1.7 10*3/uL (ref 1.4–7.7)
NEUTROS PCT: 38.9 % — AB (ref 43.0–77.0)
PLATELETS: 222 10*3/uL (ref 150.0–400.0)
RBC: 4.87 Mil/uL (ref 3.87–5.11)
RDW: 12.2 % (ref 11.5–15.5)
WBC: 4.3 10*3/uL (ref 4.0–10.5)

## 2017-04-28 LAB — URINALYSIS
BILIRUBIN URINE: NEGATIVE
Hgb urine dipstick: NEGATIVE
KETONES UR: NEGATIVE
LEUKOCYTES UA: NEGATIVE
Nitrite: NEGATIVE
PH: 7 (ref 5.0–8.0)
Specific Gravity, Urine: <=1.005 — AB (ref 1.000–1.030)
Total Protein, Urine: NEGATIVE
UROBILINOGEN UA: 0.2 (ref 0.0–1.0)
Urine Glucose: NEGATIVE

## 2017-04-28 LAB — LIPID PANEL
CHOL/HDL RATIO: 2
Cholesterol: 177 mg/dL (ref 0–200)
HDL: 90.8 mg/dL (ref >39.00)
LDL Cholesterol: 73 mg/dL (ref 0–99)
NONHDL: 86.07
TRIGLYCERIDES: 65 mg/dL (ref 0.0–149.0)
VLDL: 13 mg/dL (ref 0.0–40.0)

## 2017-04-28 LAB — HEPATIC FUNCTION PANEL
ALT: 15 U/L (ref 0–35)
AST: 21 U/L (ref 0–37)
Albumin: 4 g/dL (ref 3.5–5.2)
Alkaline Phosphatase: 41 U/L (ref 39–117)
BILIRUBIN DIRECT: 0.1 mg/dL (ref 0.0–0.3)
TOTAL PROTEIN: 7 g/dL (ref 6.0–8.3)
Total Bilirubin: 1 mg/dL (ref 0.2–1.2)

## 2017-04-28 LAB — TSH: TSH: 1.3 u[IU]/mL (ref 0.35–4.50)

## 2017-04-28 MED ORDER — WOMENS MULTI VITAMIN & MINERAL PO TABS: ORAL_TABLET | ORAL | 3 refills | Status: DC

## 2017-04-28 NOTE — Assessment & Plan Note (Addendum)
We discussed age appropriate health related issues, including available/recomended screening tests and vaccinations. We discussed a need for adhering to healthy diet and exercise. Labs were ordered to be later reviewed . All questions were answered. GYN q 12 mo Shots are up to date

## 2017-04-28 NOTE — Progress Notes (Signed)
Subjective:  Patient ID: Theresa Carlson, female    DOB: [birth year]  Age: 26 y.o. MRN: [id number]  CC: No chief complaint on file.   HPI Theresa Carlson presents for a well exam  Outpatient Medications Prior to Visit  Medication Sig Dispense Refill  . budesonide (RHINOCORT AQUA) 32 MCG/ACT nasal spray Place 1 spray into both nostrils 3 (three) times a week.    Theresa Carlson Estradiol (PREVIFEM PO) Take 1 tablet by mouth.    Theresa Carlson Kitchen azithromycin (ZITHROMAX) 250 MG tablet Take 2 tabs now then 1 daily times 4 days 6 tablet 0  . benzonatate (TESSALON PERLES) 100 MG capsule Take 1-2 capsules (100-200 mg total) by mouth every 8 (eight) hours as needed for cough. 30 capsule 0  . benzonatate (TESSALON) 200 MG capsule Take 1 capsule (200 mg total) by mouth 3 (three) times daily as needed for cough. 60 capsule 0  . cefdinir (OMNICEF) 300 MG capsule Take 1 capsule (300 mg total) by mouth 2 (two) times daily. 20 capsule 0  . Cholecalciferol (VITAMIN D3) 2000 units capsule Take 1 capsule (2,000 Units total) by mouth daily. 100 capsule 3  . predniSONE (DELTASONE) 5 MG tablet Take 1 tablet (5 mg total) by mouth as directed. sterapred generic taper 21 tablet 0  . pseudoephedrine (SUDAFED) 120 MG 12 hr tablet Take 1 tablet (120 mg total) by mouth every 12 (twelve) hours as needed for congestion. 60 tablet 1   No facility-administered medications prior to visit.     ROS Review of Systems  Constitutional: Negative for activity change, appetite change, chills, fatigue and unexpected weight change.  HENT: Negative for congestion, mouth sores and sinus pressure.   Eyes: Negative for visual disturbance.  Respiratory: Negative for cough and chest tightness.   Gastrointestinal: Negative for abdominal pain and nausea.  Genitourinary: Negative for difficulty urinating, frequency and vaginal pain.  Musculoskeletal: Negative for back pain and gait problem.  Skin: Negative for pallor and rash.  Neurological:  Negative for dizziness, tremors, weakness, numbness and headaches.  Psychiatric/Behavioral: Negative for confusion, sleep disturbance and suicidal ideas.    Objective:  BP 114/76 (BP Location: Left Arm, Patient Position: Sitting, Cuff Size: Normal)   Pulse 75   Temp 98.8 F (37.1 C) (Oral)   Ht 5\' 5"  (1.651 m)   Wt 119 lb (54 kg)   SpO2 99%   BMI 19.80 kg/m   BP Readings from Last 3 Encounters:  04/28/17 114/76  05/07/16 112/78  06/23/15 118/78    Wt Readings from Last 3 Encounters:  04/28/17 119 lb (54 kg)  05/07/16 118 lb (53.5 kg)  06/23/15 114 lb (51.7 kg)    Physical Exam  Constitutional: She appears well-developed. No distress.  HENT:  Head: Normocephalic.  Right Ear: External ear normal.  Left Ear: External ear normal.  Nose: Nose normal.  Mouth/Throat: Oropharynx is clear and moist.  Eyes: Pupils are equal, round, and reactive to light. Conjunctivae are normal. Right eye exhibits no discharge. Left eye exhibits no discharge.  Neck: Normal range of motion. Neck supple. No JVD present. No tracheal deviation present. No thyromegaly present.  Cardiovascular: Normal rate, regular rhythm and normal heart sounds.   Pulmonary/Chest: No stridor. No respiratory distress. She has no wheezes.  Abdominal: Soft. Bowel sounds are normal. She exhibits no distension and no mass. There is no tenderness. There is no rebound and no guarding.  Musculoskeletal: She exhibits no edema or tenderness.  Lymphadenopathy:    She has  no cervical adenopathy.  Neurological: She displays normal reflexes. No cranial nerve deficit. She exhibits normal muscle tone. Coordination normal.  Skin: No rash noted. No erythema.  Psychiatric: She has a normal mood and affect. Her behavior is normal. Judgment and thought content normal.    Lab Results  Component Value Date   WBC 5.7 05/07/2016   HGB 14.4 05/07/2016   HCT 42.0 05/07/2016   PLT 215.0 05/07/2016   GLUCOSE 90 05/07/2016   CHOL 178  05/07/2016   TRIG 88.0 05/07/2016   HDL 83.30 05/07/2016   LDLCALC 77 05/07/2016   ALT 12 05/07/2016   AST 17 05/07/2016   NA 138 05/07/2016   K 3.9 05/07/2016   CL 104 05/07/2016   CREATININE 0.62 05/07/2016   BUN 12 05/07/2016   CO2 29 05/07/2016   TSH 0.93 05/07/2016    No results found.  Assessment & Plan:   There are no diagnoses linked to this encounter. I have discontinued Ms. Theresa Carlson's benzonatate, cefdinir, pseudoephedrine, Vitamin D3, predniSONE, benzonatate, and azithromycin. I am also having her maintain her Norgestimate-Eth Estradiol (PREVIFEM PO) and budesonide.  No orders of the defined types were placed in this encounter.    Follow-up: No Follow-up on file.  Sonda PrimesAlex Serafin [place], MD

## 2017-04-29 ENCOUNTER — Encounter: Payer: Self-pay | Admitting: Internal Medicine

## 2017-06-30 DIAGNOSIS — N764 Abscess of vulva: Secondary | ICD-10-CM | POA: Diagnosis not present

## 2017-08-18 DIAGNOSIS — Z681 Body mass index (BMI) 19 or less, adult: Secondary | ICD-10-CM | POA: Diagnosis not present

## 2017-08-18 DIAGNOSIS — Z01419 Encounter for gynecological examination (general) (routine) without abnormal findings: Secondary | ICD-10-CM | POA: Diagnosis not present

## 2017-09-01 ENCOUNTER — Telehealth: Payer: 59 | Admitting: Family

## 2017-09-01 DIAGNOSIS — J028 Acute pharyngitis due to other specified organisms: Secondary | ICD-10-CM

## 2017-09-01 DIAGNOSIS — B9689 Other specified bacterial agents as the cause of diseases classified elsewhere: Secondary | ICD-10-CM | POA: Diagnosis not present

## 2017-09-01 MED ORDER — BENZONATATE 100 MG PO CAPS: 100.0000 mg | ORAL_CAPSULE | Freq: Three times a day (TID) | ORAL | 0 refills | Status: DC | PRN

## 2017-09-01 MED ORDER — PREDNISONE 5 MG PO TABS: 5.0000 mg | ORAL_TABLET | ORAL | 0 refills | Status: DC

## 2017-09-01 MED ORDER — AZITHROMYCIN 250 MG PO TABS: ORAL_TABLET | ORAL | 0 refills | Status: DC

## 2017-09-01 NOTE — Progress Notes (Signed)

## 2017-10-29 DIAGNOSIS — Z319 Encounter for procreative management, unspecified: Secondary | ICD-10-CM | POA: Diagnosis not present

## 2017-10-29 DIAGNOSIS — Z3040 Encounter for surveillance of contraceptives, unspecified: Secondary | ICD-10-CM | POA: Diagnosis not present

## 2017-11-10 ENCOUNTER — Encounter (HOSPITAL_COMMUNITY): Payer: Self-pay | Admitting: Emergency Medicine

## 2017-11-10 ENCOUNTER — Ambulatory Visit (HOSPITAL_COMMUNITY)
Admission: EM | Admit: 2017-11-10 | Discharge: 2017-11-10 | Disposition: A | Discharge status: Home / Self Care | Payer: 59 | Attending: Family Medicine | Admitting: Family Medicine

## 2017-11-10 DIAGNOSIS — K529 Noninfective gastroenteritis and colitis, unspecified: Secondary | ICD-10-CM | POA: Diagnosis not present

## 2017-11-10 MED ORDER — ONDANSETRON 4 MG PO TBDP
4.0000 mg | ORAL_TABLET | Freq: Once | ORAL | Status: AC
  Administered 2017-11-10: 4 mg via ORAL

## 2017-11-10 MED ORDER — ONDANSETRON 4 MG PO TBDP: 4.0000 mg | ORAL_TABLET | Freq: Three times a day (TID) | ORAL | 0 refills | Status: DC | PRN

## 2017-11-10 MED ORDER — ONDANSETRON 4 MG PO TBDP
ORAL_TABLET | ORAL | Status: AC
  Filled 2017-11-10: qty 1

## 2017-11-10 NOTE — ED Provider Notes (Signed)
[facility]    CSN: [id number] Arrival date & time: 11/10/17  1947     History   Chief Complaint Chief Complaint  Patient presents with  . Diarrhea  . Emesis    HPI Theresa Carlson is a 27 y.o. female.   Theresa Carlson presents with S/O with complaints of generalized abdominal pain as well as diarrhea, nausea and vomiting which started at 0800 this morning. S/O felt nausea this morning but has since felt well. Last emesis at approximately 1900 tonight, started around 1530, after diarrhea. Took 4 imodium which has minimally helped, last diarrhea approximately 1630 today. No blood in vomit or stool. Ate lunch which then has since come up. No fevers. Odor to stool and vomit. Belching. Watery diarrhea. At least 10 episodes today. Decreased urination. Without contributing medical history.  No recent travel.    ROS per HPI.          Past Medical History:  Diagnosis Date  . Asthma    as a child    Patient Active Problem List   Diagnosis Date Noted  . Well adult exam 05/07/2016  . Acute URI 06/23/2015    Past Surgical History:  Procedure Laterality Date  . trigger thumb      OB History   None      Home Medications    Prior to Admission medications   Medication Sig Start Date End Date Taking? Authorizing Provider  azithromycin (ZITHROMAX) 250 MG tablet Take 2 tabs now then 1 daily times 4 days 09/01/17   Withrow, Everardo All, FNP  benzonatate (TESSALON PERLES) 100 MG capsule Take 1-2 capsules (100-200 mg total) by mouth every 8 (eight) hours as needed for cough. 09/01/17   Withrow, Everardo All, FNP  budesonide (RHINOCORT AQUA) 32 MCG/ACT nasal spray Place 1 spray into both nostrils 3 (three) times a week.    [provider]  Multiple Vitamins-Minerals (WOMENS MULTI VITAMIN & MINERAL) TABS 1 po qd 04/28/17   Plotnikov, Georgina Quint, MD  Norgestimate-Eth Estradiol (PREVIFEM PO) Take 1 tablet by mouth.    [provider]  ondansetron (ZOFRAN-ODT) 4 MG  disintegrating tablet Take 1 tablet (4 mg total) by mouth every 8 (eight) hours as needed for nausea or vomiting. 11/10/17   Georgetta Haber, NP  predniSONE (DELTASONE) 5 MG tablet Take 1 tablet (5 mg total) by mouth as directed. Taper 6,5,4,3,2,1 09/01/17   Withrow, Everardo All, FNP    Family History Family History  Problem Relation Age of Onset  . Hypertension Mother   . Eczema Mother   . Hypertension Father   . Stroke Maternal Grandfather   . Prostate cancer Maternal Grandfather   . Breast cancer Paternal Grandmother   . Lung cancer Paternal Grandfather     Social History Social History   Tobacco Use  . Smoking status: Never Smoker  . Smokeless tobacco: Never Used  Substance Use Topics  . Alcohol use: Yes    Alcohol/week: 0.6 oz    Types: 1 Glasses of wine per week    Comment: occasonally   . Drug use: No     Allergies   Patient has no known allergies.   Review of Systems Review of Systems   Physical Exam Triage Vital Signs ED Triage Vitals [11/10/17 2001]  Enc Vitals Group     BP 122/90     Pulse Rate 100     Resp 18     Temp 98.1 F (36.7 C)  Temp src      SpO2 100 %     Weight      Height      Head Circumference      Peak Flow      Pain Score      Pain Loc      Pain Edu?      Excl. in GC?    No data found.  Updated Vital Signs BP 122/90   Pulse 100   Temp 98.1 F (36.7 C)   Resp 18   LMP 10/27/2017   SpO2 100%    Physical Exam  Constitutional: She is oriented to person, place, and time. She appears well-developed and well-nourished. No distress.  Cardiovascular: Normal rate, regular rhythm and normal heart sounds.  Pulmonary/Chest: Effort normal and breath sounds normal.  Abdominal: Soft. Bowel sounds are normal. There is generalized tenderness.  Generalized abdominal discomfort without point tenderness  Neurological: She is alert and oriented to person, place, and time.  Skin: Skin is warm and dry.     UC Treatments / Results   Labs (all labs ordered are listed, but only abnormal results are displayed) Labs Reviewed - No data to display  EKG None  Radiology No results found.  Procedures Procedures (including critical care time)  Medications Ordered in UC Medications  ondansetron (ZOFRAN-ODT) disintegrating tablet 4 mg (4 mg Oral Given 11/10/17 2018)    Initial Impression / Assessment and Plan / UC Course  I have reviewed the triage vital signs and the nursing notes.  Pertinent labs & imaging results that were available during my care of the patient were reviewed by me and considered in my medical decision making (see chart for details).     Afebrile. Without red flag findings on exam. Consistent with gastroenteritis. zofran provided. Fluid intake encouraged. Fluids only until symptoms have improved. Return precautions provided. Patient verbalized understanding and agreeable to plan.  Ambulatory out of clinic without difficulty.     Final Clinical Impressions(s) / UC Diagnoses   Final diagnoses:  Gastroenteritis     Discharge Instructions     Increase fluid intake. No food until symptoms have significantly improved. Advance to bland diet as tolerated.  Zofran every 8 hours as needed. If develop worsening of pain, fevers, dizziness, or light headedness, blood in stool or vomit please return or go to Er.    ED Prescriptions    Medication Sig Dispense Auth. Provider   ondansetron (ZOFRAN-ODT) 4 MG disintegrating tablet Take 1 tablet (4 mg total) by mouth every 8 (eight) hours as needed for nausea or vomiting. 12 tablet Georgetta Haber, NP     Controlled Substance Prescriptions [place] Controlled Substance Registry consulted? Not Applicable   Georgetta Haber, NP 11/10/17 2036

## 2017-11-10 NOTE — Discharge Instructions (Addendum)
Increase fluid intake. No food until symptoms have significantly improved. Advance to bland diet as tolerated.  Zofran every 8 hours as needed. If develop worsening of pain, fevers, dizziness, or light headedness, blood in stool or vomit please return or go to Er.

## 2017-11-10 NOTE — ED Triage Notes (Signed)
Pt c/o n/v/d starting today, taking imodium without relief.

## 2017-11-12 ENCOUNTER — Other Ambulatory Visit: Payer: Self-pay

## 2017-11-12 ENCOUNTER — Telehealth: Payer: 59 | Admitting: Family

## 2017-11-12 ENCOUNTER — Ambulatory Visit (HOSPITAL_COMMUNITY)
Admission: EM | Admit: 2017-11-12 | Discharge: 2017-11-12 | Disposition: A | Discharge status: Home / Self Care | Payer: 59 | Attending: Family Medicine | Admitting: Family Medicine

## 2017-11-12 ENCOUNTER — Encounter (HOSPITAL_COMMUNITY): Payer: Self-pay | Admitting: Emergency Medicine

## 2017-11-12 DIAGNOSIS — A084 Viral intestinal infection, unspecified: Secondary | ICD-10-CM | POA: Diagnosis not present

## 2017-11-12 DIAGNOSIS — R112 Nausea with vomiting, unspecified: Secondary | ICD-10-CM

## 2017-11-12 DIAGNOSIS — R1084 Generalized abdominal pain: Secondary | ICD-10-CM

## 2017-11-12 DIAGNOSIS — R197 Diarrhea, unspecified: Secondary | ICD-10-CM

## 2017-11-12 MED ORDER — SODIUM CHLORIDE 0.9 % IV BOLUS
1000.0000 mL | Freq: Once | INTRAVENOUS | Status: AC
  Administered 2017-11-12: 1000 mL via INTRAVENOUS

## 2017-11-12 NOTE — Discharge Instructions (Signed)
Please do your best to ensure adequate fluid intake in order to avoid dehydration. If you find that you are unable to tolerate drinking fluids regularly please return for evaluation.

## 2017-11-12 NOTE — ED Triage Notes (Signed)
The patient presented to the [facility] with a complaint of N/V/D that she was evaluated for on 11/10/2017. The patient reported that she was able to keep fluids down but no foods.

## 2017-11-12 NOTE — ED Provider Notes (Signed)
[facility]   [id number] 11/12/17 Arrival Time: 1304  ASSESSMENT & PLAN:  1. Viral gastroenteritis    Meds ordered this encounter  Medications  . sodium chloride 0.9 % bolus 1,000 mL   Suspect norovirus-like etiology. If so, she should be seeing improvement over the next 24-48 hours. Has Zofran at home to use if needed. 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 Note given.  Will do her best to ensure adequate fluid intake in order to avoid dehydration. Will return here for evaluation if unable to tolerate PO fluids regularly.  Reviewed expectations re: course of current medical issues. Questions answered. Outlined signs and symptoms indicating need for more acute intervention. Patient verbalized understanding. After Visit Summary given.   SUBJECTIVE: History from: patient.  Theresa Carlson is a 27 y.o. female who presents with complaint of non-bloody intermittent but frequent nausea and vomiting with diarrhea. Onset abrupt, approximately 48 horus ago. Abdominal discomfort: mild and cramping. Symptoms are unchanged since beginning. Stool with foul odor. Aggravating factors: eating. Alleviating factors: none. Associated symptoms: fatigue. She denies fever. Appetite: decreased. PO intake: decreased. Able to sip on water. Ambulatory without assistance. Urinary symptoms: none. Last bowel movement today without blood. OTC treatment: none. Zofran with mild help. No sick contacts. Patient's last menstrual period was 10/27/2017.  Past Surgical History:  Procedure Laterality Date  . trigger thumb      ROS: As per HPI.  OBJECTIVE:  Vitals:   11/12/17 1322  BP: 122/86  Pulse: 96  Resp: 18  Temp: 98.2 F (36.8 C)  TempSrc: Oral  SpO2: 100%    General appearance: alert; no distress Oropharynx: dry Lungs: unlabored respirations Heart: regular Abdomen: declines secondary to cramping and discomfort Extremities: no edema; symmetrical with no  gross deformities Skin: warm and dry Neurologic: normal gait  Psychological: alert and cooperative; normal mood and affect  No Known Allergies                                             Past Medical History:  Diagnosis Date  . Asthma    as a child   Social History   Socioeconomic History  . Marital status: Married    Spouse name: Not on file  . Number of children: Not on file  . Years of education: Not on file  . Highest education level: Not on file  Occupational History  . Occupation: Teacher, adult education: [employer]  Social Needs  . Financial resource strain: Not on file  . Food insecurity:    Worry: Not on file    Inability: Not on file  . Transportation needs:    Medical: Not on file    Non-medical: Not on file  Tobacco Use  . Smoking status: Never Smoker  . Smokeless tobacco: Never Used  Substance and Sexual Activity  . Alcohol use: Yes    Alcohol/week: 0.6 oz    Types: 1 Glasses of wine per week    Comment: occasonally   . Drug use: No  . Sexual activity: Not on file  Lifestyle  . Physical activity:    Days per week: Not on file    Minutes per session: Not on file  . Stress: Not on file  Relationships  . Social connections:    Talks on phone: Not on file  Gets together: Not on file    Attends religious service: Not on file    Active member of club or organization: Not on file    Attends meetings of clubs or organizations: Not on file    Relationship status: Not on file  . Intimate partner violence:    Fear of current or ex partner: Not on file    Emotionally abused: Not on file    Physically abused: Not on file    Forced sexual activity: Not on file  Other Topics Concern  . Not on file  Social History Narrative  . Not on file   Family History  Problem Relation Age of Onset  . Hypertension Mother   . Eczema Mother   . Hypertension Father   . Stroke Maternal Grandfather   . Prostate cancer Maternal Grandfather   . Breast cancer Paternal  Grandmother   . Lung cancer Paternal Glynda Jaeger, MD 11/13/17 1004

## 2017-11-12 NOTE — Progress Notes (Signed)
Based on what you shared with me it looks like you have a serious condition that should be evaluated in a face to face office visit.  NOTE: If you entered your credit card information for this eVisit, you will not be charged. You may see a "hold" on your card for the $30 but that hold will drop off and you will not have a charge processed.  If you are having a true medical emergency please call 911.  If you need an urgent face to face visit, [place] has four urgent care centers for your convenience.  If you need care fast and have a high deductible or no insurance consider:   [email or web address] to reserve your spot online an avoid wait times  [facility] [address], [address] [place], [place] [address] 8 am to 8 pm Monday-Friday 10 am to 4 pm Saturday-Sunday *Across the street from [employer]  [facility]  [address] [place], [address] 8 am to 5 pm Monday-Friday * In the [facility]   The following sites will take your  insurance:  . [facility]  [phone] Get Driving Directions Find a Provider at this Location  [address] [place], [place] [address] . 10 am to 8 pm Monday-Friday . 12 pm to 8 pm Saturday-Sunday   . [facility]  [phone] Get Driving Directions Find a Provider at this Location  [address], [address] , [place] [address] . 8 am to 8 pm Monday-Friday . 9 am to 6 pm Saturday . 11 am to 6 pm Sunday   . [facility] [place]  [phone] Get Driving Directions  [address] [place], [place] [address] . 8 am to 8 pm Monday-Friday . 8 am to 4 pm Saturday-Sunday   Your e-visit answers were reviewed by a board certified advanced clinical practitioner to complete your personal care plan.  Thank you for using e-Visits.  

## 2018-01-16 ENCOUNTER — Encounter: Payer: Self-pay | Admitting: Internal Medicine

## 2018-01-19 ENCOUNTER — Ambulatory Visit: Payer: 59 | Admitting: Family

## 2018-01-21 ENCOUNTER — Ambulatory Visit: Payer: 59 | Admitting: Family

## 2018-01-21 ENCOUNTER — Encounter: Payer: Self-pay | Admitting: Internal Medicine

## 2018-01-21 ENCOUNTER — Ambulatory Visit: Payer: 59 | Admitting: Internal Medicine

## 2018-01-21 DIAGNOSIS — M25561 Pain in right knee: Secondary | ICD-10-CM | POA: Diagnosis not present

## 2018-01-21 MED ORDER — DICLOFENAC SODIUM 1 % TD GEL: 2.0000 g | Freq: Four times a day (QID) | TRANSDERMAL | 1 refills | Status: DC

## 2018-01-21 NOTE — Patient Instructions (Signed)
Patellofemoral Pain Syndrome Patellofemoral pain syndrome is a condition that involves a softening or breakdown of the tissue (cartilage) on the underside of your kneecap (patella). This causes pain in the front of the knee. The condition is also called runner's knee or chondromalacia patella. Patellofemoral pain syndrome is most common in young adults who are active in sports. Your knee is the largest joint in your body. The patella covers the front of your knee and is attached to muscles above and below your knee. The underside of the patella is covered with a smooth type of cartilage (synovium). The smooth surface helps the patella glide easily when you move your knee. Patellofemoral pain syndrome causes swelling in the joint linings and bone surfaces in your knee. What are the causes? Patellofemoral pain syndrome can be caused by:  Overuse.  Poor alignment of your knee joints.  Weak leg muscles.  A direct blow to your kneecap.  What increases the risk? You may be at risk for patellofemoral pain syndrome if you:  Do a lot of activities that can wear down your kneecap. These include: ? Running. ? Squatting. ? Climbing stairs.  Start a new physical activity or exercise program.  Wear shoes that do not fit well.  Do not have good leg strength.  Are overweight.  What are the signs or symptoms? Knee pain is the most common symptom of patellofemoral pain syndrome. This may feel like a dull, aching pain underneath your patella, in the front of your knee. There may be a popping or cracking sound when you move your knee. Pain may get worse with:  Exercise.  Climbing stairs.  Running.  Jumping.  Squatting.  Kneeling.  Sitting for a long time.  Moving or pushing on your patella.  How is this diagnosed? Your health care provider may be able to diagnose patellofemoral pain syndrome from your symptoms and medical history. You may be asked about your recent physical activities  and which ones cause knee pain. Your health care provider may do a physical exam with certain tests to confirm the diagnosis. These may include:  Moving your patella back and forth.  Checking your range of knee motion.  Having you squat or jump to see if you have pain.  Checking the strength of your leg muscles.  An MRI of the knee may also be done. How is this treated? Patellofemoral pain syndrome can usually be treated at home with rest, ice, compression, and elevation (RICE). Other treatments may include:  Nonsteroidal anti-inflammatory drugs (NSAIDs).  Physical therapy to stretch and strengthen your leg muscles.  Shoe inserts (orthotics) to take stress off your knee.  A knee brace or knee support.  Surgery to remove damaged cartilage or move the patella to a better position. The need for surgery is rare.  Follow these instructions at home:  Take medicines only as directed by your health care provider.  Rest your knee. ? When resting, keep your knee raised above the level of your heart. ? Avoid activities that cause knee pain.  Apply ice to the injured area: ? Put ice in a plastic bag. ? Place a towel between your skin and the bag. ? Leave the ice on for 20 minutes, 2-3 times a day.  Use splints, braces, knee supports, or walking aids as directed by your health care provider.  Perform stretching and strengthening exercises as directed by your health care provider or physical therapist.  Keep all follow-up visits as directed by your health care   provider. This is important. Contact a health care provider if:  Your symptoms get worse.  You are not improving with home care. This information is not intended to replace advice given to you by your health care provider. Make sure you discuss any questions you have with your health care provider. Document Released: 06/05/2009 Document Revised: 11/23/2015 Document Reviewed: 09/06/2013 Elsevier Interactive Patient Education   2018 Elsevier Inc.  

## 2018-01-21 NOTE — Assessment & Plan Note (Addendum)
Patellofemoral pain vs other Voltaren gel Rx/ice/info Sports med ref --- Dr Katrinka BlazingSmith

## 2018-01-21 NOTE — Progress Notes (Signed)
Subjective:  Patient ID: Theresa Carlson, female    DOB: May 10, 1991  Age: 27 y.o. MRN: [id number]  CC: No chief complaint on file.   HPI Theresa Carlson presents for R knee pain w/extension x 4 mo. Has not used NSAIDs - no interest. Using cupping, essential oils. Worse with going up the steps Crossfit 3/week. Not pregnant  Outpatient Medications Prior to Visit  Medication Sig Dispense Refill  . Digestive Aids Mixture (DIGESTION GB PO) Take by mouth.    . FOLIC ACID PO Take by mouth.    . Grapefruit Oil OIL by Does not apply route.    . Multiple Vitamins-Minerals (WOMENS MULTI PO) Take by mouth.    . TURMERIC PO Take by mouth.    . ondansetron (ZOFRAN-ODT) 4 MG disintegrating tablet Take 1 tablet (4 mg total) by mouth every 8 (eight) hours as needed for nausea or vomiting. 12 tablet 0   No facility-administered medications prior to visit.     ROS: Review of Systems  Constitutional: Negative for activity change, appetite change, chills, fatigue and unexpected weight change.  HENT: Negative for congestion, mouth sores and sinus pressure.   Eyes: Negative for visual disturbance.  Respiratory: Negative for cough and chest tightness.   Gastrointestinal: Negative for abdominal pain and nausea.  Genitourinary: Negative for difficulty urinating, frequency and vaginal pain.  Musculoskeletal: Positive for gait problem. Negative for back pain.  Skin: Negative for pallor and rash.  Neurological: Negative for dizziness, tremors, weakness, numbness and headaches.  Psychiatric/Behavioral: Negative for confusion and sleep disturbance.  L knee is tender w/ROM. Mobile patella. No edema  Objective:  BP 114/66 (BP Location: Left Arm, Patient Position: Sitting, Cuff Size: Normal)   Pulse 80   Temp 98.6 F (37 C) (Oral)   Ht 5\' 5"  (1.651 m)   Wt 118 lb (53.5 kg)   SpO2 99%   BMI 19.64 kg/m   BP Readings from Last 3 Encounters:  01/21/18 114/66  11/12/17 122/86  11/10/17 122/90    Wt  Readings from Last 3 Encounters:  01/21/18 118 lb (53.5 kg)  04/28/17 119 lb (54 kg)  05/07/16 118 lb (53.5 kg)    Physical Exam  Constitutional: She appears well-developed. No distress.  HENT:  Head: Normocephalic.  Right Ear: External ear normal.  Left Ear: External ear normal.  Nose: Nose normal.  Mouth/Throat: Oropharynx is clear and moist.  Eyes: Pupils are equal, round, and reactive to light. Conjunctivae are normal. Right eye exhibits no discharge. Left eye exhibits no discharge.  Neck: Normal range of motion. Neck supple.  Cardiovascular: Normal rate, regular rhythm and normal heart sounds.  Abdominal: Soft. Bowel sounds are normal.  Musculoskeletal: She exhibits tenderness. She exhibits no edema or deformity.  Neurological: She exhibits normal muscle tone. Coordination normal.  Skin: No rash noted. No erythema.  Psychiatric: She has a normal mood and affect. Her behavior is normal. Judgment and thought content normal.    Lab Results  Component Value Date   WBC 4.3 04/28/2017   HGB 15.1 (H) 04/28/2017   HCT 44.6 04/28/2017   PLT 222.0 04/28/2017   GLUCOSE 89 04/28/2017   CHOL 177 04/28/2017   TRIG 65.0 04/28/2017   HDL 90.80 04/28/2017   LDLCALC 73 04/28/2017   ALT 15 04/28/2017   AST 21 04/28/2017   NA 138 04/28/2017   K 4.0 04/28/2017   CL 102 04/28/2017   CREATININE 0.60 04/28/2017   BUN 14 04/28/2017   CO2 29  04/28/2017   TSH 1.30 04/28/2017    No results found.  Assessment & Plan:   There are no diagnoses linked to this encounter.   No orders of the defined types were placed in this encounter.    Follow-up: No follow-ups on file.  Sonda PrimesAlex Plotnikov, MD

## 2018-02-04 ENCOUNTER — Ambulatory Visit: Payer: 59 | Admitting: Internal Medicine

## 2018-02-12 NOTE — Progress Notes (Signed)
Tawana ScaleZach Danarius Mcconathy D.O.  Sports Medicine [address] [place], [place] [address] Phone: [phone] Subjective:    I'm seeing this patient by the request  of:  Plotnikov, Georgina QuintAleksei V, MD   CC: right knee pain   BJY:NWGNFAOZHYHPI:Subjective  Theresa Carlson is a 27 y.o. female coming in with complaint of right knee pain. Was kickboxing but switched workouts to crossfit. Stretches after each workout. Uses turmeric and vit d. Has pain over patellar tendon. Experiences popping of that knee, specifically with knee flexion. Does wear a brace during workouts which alleviates her pain. Does not do any jumping. Denies any catching. No prior history of injury.  Patient states sometimes her to be swelling of the area.  Symptoms get better as well.  Patient states that on daily activities does not seem to be too painful     Past Medical History:  Diagnosis Date  . Asthma    as a child   Past Surgical History:  Procedure Laterality Date  . trigger thumb     Social History   Socioeconomic History  . Marital status: Married    Spouse name: Not on file  . Number of children: Not on file  . Years of education: Not on file  . Highest education level: Not on file  Occupational History  . Occupation: Teacher, adult educationN    Employer: [employer]  Social Needs  . Financial resource strain: Not on file  . Food insecurity:    Worry: Not on file    Inability: Not on file  . Transportation needs:    Medical: Not on file    Non-medical: Not on file  Tobacco Use  . Smoking status: Never Smoker  . Smokeless tobacco: Never Used  Substance and Sexual Activity  . Alcohol use: Yes    Alcohol/week: 1.0 standard drinks    Types: 1 Glasses of wine per week    Comment: occasonally   . Drug use: No  . Sexual activity: Not on file  Lifestyle  . Physical activity:    Days per week: Not on file    Minutes per session: Not on file  . Stress: Not on file  Relationships  . Social connections:    Talks on phone: Not on file    Gets  together: Not on file    Attends religious service: Not on file    Active member of club or organization: Not on file    Attends meetings of clubs or organizations: Not on file    Relationship status: Not on file  Other Topics Concern  . Not on file  Social History Narrative  . Not on file   No Known Allergies Family History  Problem Relation Age of Onset  . Hypertension Mother   . Eczema Mother   . Hypertension Father   . Stroke Maternal Grandfather   . Prostate cancer Maternal Grandfather   . Breast cancer Paternal Grandmother   . Lung cancer Paternal Grandfather      Past medical history, social, surgical and family history all reviewed in electronic medical record.  No pertanent information unless stated regarding to the chief complaint.   Review of Systems:Review of systems updated and as accurate as of 02/12/18  No headache, visual changes, nausea, vomiting, diarrhea, constipation, dizziness, abdominal pain, skin rash, fevers, chills, night sweats, weight loss, swollen lymph nodes, body aches, joint swelling, muscle aches, chest pain, shortness of breath, mood changes.   Objective  There were no vitals taken for  this visit. Systems examined below as of 02/12/18   General: No apparent distress alert and oriented x3 mood and affect normal, dressed appropriately.  HEENT: Pupils equal, extraocular movements intact  Respiratory: Patient's speak in full sentences and does not appear short of breath  Cardiovascular: No lower extremity edema, non tender, no erythema  Skin: Warm dry intact with no signs of infection or rash on extremities or on axial skeleton.  Abdomen: Soft nontender  Neuro: Cranial nerves II through XII are intact, neurovascularly intact in all extremities with 2+ DTRs and 2+ pulses.  Lymph: No lymphadenopathy of posterior or anterior cervical chain or axillae bilaterally.  Gait normal with good balance and coordination.  MSK:  Non tender with full range of  motion and good stability and symmetric strength and tone of shoulders, elbows, wrist, hip, and ankles bilaterally.  Knee: Right Normal to inspection with no erythema or effusion or obvious bony abnormalities.  Mild patella alta Palpation normal with no warmth, joint line tenderness, patellar tenderness inferior pole as well as the patella tendon, or condyle tenderness. ROM full in flexion and extension and lower leg rotation. Ligaments with solid consistent endpoints including ACL, PCL, LCL, MCL. Negative Mcmurray's, Apley's, and Thessalonian tests. Mild painful patellar compression. Patellar glide with mild crepitus. Patellar and quadriceps tendons unremarkable. Hamstring and quadriceps strength is normal. Contralateral knee unremarkable  MSK US performed of: Right knee This study was ordered, performed, and interpreted by Terrilee FilesZach Polette Nofsinger D.O.  Knee: All structures visualized. Medial discoid meniscus noted Patellar Tendon no significant hypoechoic changes but at the insertion of the tibial tuberosity possible small avulsion that seems to be nonhealing No abnormality of prepatellar bursa. LCL and MCL unremarkable on long and transverse views. No abnormality of origin of medial or lateral head of the gastrocnemius.  IMPRESSION: Patella tendinitis with nonhealing avulsion of the tibial tuberosity   Impression and Recommendations:     This case required medical decision making of moderate complexity.      Note: This dictation was prepared with Dragon dictation along with smaller phrase technology. Any transcriptional errors that result from this process are unintentional.

## 2018-02-13 ENCOUNTER — Ambulatory Visit: Payer: Self-pay

## 2018-02-13 ENCOUNTER — Encounter: Payer: Self-pay | Admitting: Family Medicine

## 2018-02-13 ENCOUNTER — Ambulatory Visit: Payer: 59 | Admitting: Family Medicine

## 2018-02-13 VITALS — BP 110/82 | HR 80 | Ht 65.0 in | Wt 118.0 lb

## 2018-02-13 DIAGNOSIS — Q682 Congenital deformity of knee: Secondary | ICD-10-CM | POA: Diagnosis not present

## 2018-02-13 DIAGNOSIS — M25561 Pain in right knee: Secondary | ICD-10-CM

## 2018-02-13 DIAGNOSIS — S82153A Displaced fracture of unspecified tibial tuberosity, initial encounter for closed fracture: Secondary | ICD-10-CM | POA: Diagnosis not present

## 2018-02-13 DIAGNOSIS — G8929 Other chronic pain: Secondary | ICD-10-CM

## 2018-02-13 MED ORDER — VITAMIN D (ERGOCALCIFEROL) 1.25 MG (50000 UNIT) PO CAPS: 50000.0000 [IU] | ORAL_CAPSULE | ORAL | 0 refills | Status: DC

## 2018-02-13 NOTE — Patient Instructions (Addendum)
Good to see you.  Ice 20 minutes 2 times daily. Usually after activity and before bed. Once weekly vitamin D for 12 weeks Wear the brace with activity  Avoid a lot of jumping if possible Turmeric 500mg  daily  Tart cherry extract any dose at night See me again in 4-5 weeks

## 2018-02-13 NOTE — Assessment & Plan Note (Signed)
Nonhealing on the right side.  Discussed icing regimen and home exercises.  Discussed which activities to doing which would avoid.  Follow-up again in 4 weeks

## 2018-02-13 NOTE — Assessment & Plan Note (Signed)
Patella alta, discussed home exercise, discussed strengthening, do not feel that any other advanced surgical interventions or imaging will be necessary.  Topical anti-inflammatories, icing regimen and vitamin D supplementation.

## 2018-02-27 DIAGNOSIS — M7651 Patellar tendinitis, right knee: Secondary | ICD-10-CM | POA: Diagnosis not present

## 2018-03-05 DIAGNOSIS — M25561 Pain in right knee: Secondary | ICD-10-CM | POA: Diagnosis not present

## 2018-03-09 DIAGNOSIS — M7651 Patellar tendinitis, right knee: Secondary | ICD-10-CM | POA: Diagnosis not present

## 2018-03-17 ENCOUNTER — Ambulatory Visit: Payer: 59 | Admitting: Family Medicine

## 2018-04-02 ENCOUNTER — Ambulatory Visit: Payer: 59 | Admitting: Internal Medicine

## 2018-04-02 ENCOUNTER — Encounter: Payer: Self-pay | Admitting: Internal Medicine

## 2018-04-02 DIAGNOSIS — L0291 Cutaneous abscess, unspecified: Secondary | ICD-10-CM

## 2018-04-02 DIAGNOSIS — M25561 Pain in right knee: Secondary | ICD-10-CM

## 2018-04-02 MED ORDER — DOXYCYCLINE HYCLATE 100 MG PO TABS
100.0000 mg | ORAL_TABLET | Freq: Two times a day (BID) | ORAL | 0 refills | Status: DC
Start: 2018-04-02 — End: 2018-05-26

## 2018-04-02 MED ORDER — IBUPROFEN 600 MG PO TABS
ORAL_TABLET | ORAL | 0 refills | Status: DC
Start: 2018-04-02 — End: 2018-05-26

## 2018-04-02 MED ORDER — MUPIROCIN 2 % EX OINT: TOPICAL_OINTMENT | CUTANEOUS | 0 refills | Status: DC

## 2018-04-02 NOTE — Assessment & Plan Note (Signed)
Better  

## 2018-04-02 NOTE — Patient Instructions (Addendum)
      Wound instructions : change dressing once a day or twice a day is needed. Change dressing after  shower in the morning.  Pat dry the wound with gauze. Pull out one inch of packing everyday and cut it off, then remove it completely. Re-dress wound with antibiotic ointment and Telfa pad or a Band-Aid of appropriate size.   Please contact us if you notice a recollection of pus in the abscess fever and chills increased pain redness red streaks near the abscess increased swelling in the area.

## 2018-04-02 NOTE — Progress Notes (Signed)
Subjective:  Patient ID: Theresa Carlson, female    DOB: 07-27-1990  Age: 27 y.o. MRN: [id number]  CC: No chief complaint on file.   HPI Theresa Carlson presents for a large painful boil on the L buttock since on 9/23. No d/c. Not better w/soaks etc No abx   Outpatient Medications Prior to Visit  Medication Sig Dispense Refill  . diclofenac sodium (VOLTAREN) 1 % GEL Apply 2-4 g topically 4 (four) times daily. 100 g 1  . Digestive Aids Mixture (DIGESTION GB PO) Take by mouth.    . FOLIC ACID PO Take by mouth.    . Grapefruit Oil OIL by Does not apply route.    . Multiple Vitamins-Minerals (WOMENS MULTI PO) Take by mouth.    . TURMERIC PO Take by mouth.    . Vitamin D, Ergocalciferol, (DRISDOL) 50000 units CAPS capsule Take 1 capsule (50,000 Units total) by mouth every 7 (seven) days. 12 capsule 0   No facility-administered medications prior to visit.     ROS: Review of Systems  Constitutional: Positive for fatigue. Negative for activity change, appetite change, chills and unexpected weight change.  HENT: Negative for congestion, mouth sores and sinus pressure.   Eyes: Negative for visual disturbance.  Respiratory: Negative for cough and chest tightness.   Gastrointestinal: Negative for abdominal pain and nausea.  Genitourinary: Negative for difficulty urinating, frequency and vaginal pain.  Musculoskeletal: Negative for back pain and gait problem.  Skin: Positive for color change. Negative for pallor and rash.  Neurological: Negative for dizziness, tremors, weakness, numbness and headaches.  Psychiatric/Behavioral: Negative for confusion and sleep disturbance.    Objective:  BP 116/78 (BP Location: Left Arm, Patient Position: Sitting, Cuff Size: Normal)   Pulse 82   Temp 98.4 F (36.9 C) (Oral)   Ht 5\' 5"  (1.651 m)   Wt 116 lb (52.6 kg)   SpO2 99%   BMI 19.30 kg/m   BP Readings from Last 3 Encounters:  04/02/18 116/78  02/13/18 110/82  01/21/18 114/66    Wt Readings  from Last 3 Encounters:  04/02/18 116 lb (52.6 kg)  02/13/18 118 lb (53.5 kg)  01/21/18 118 lb (53.5 kg)    Physical Exam  Constitutional: She appears well-developed. No distress.  HENT:  Head: Normocephalic.  Right Ear: External ear normal.  Left Ear: External ear normal.  Nose: Nose normal.  Mouth/Throat: Oropharynx is clear and moist.  Eyes: Pupils are equal, round, and reactive to light. Conjunctivae are normal. Right eye exhibits no discharge. Left eye exhibits no discharge.  Neck: Normal range of motion. Neck supple. No JVD present. No tracheal deviation present. No thyromegaly present.  Cardiovascular: Normal rate, regular rhythm and normal heart sounds.  Pulmonary/Chest: No stridor. No respiratory distress. She has no wheezes.  Abdominal: Soft. Bowel sounds are normal. She exhibits no distension and no mass. There is no tenderness. There is no rebound and no guarding.  Musculoskeletal: She exhibits tenderness. She exhibits no edema.  Lymphadenopathy:    She has no cervical adenopathy.  Neurological: She displays normal reflexes. No cranial nerve deficit. She exhibits normal muscle tone. Coordination normal.  Skin: No rash noted. There is erythema.  Psychiatric: She has a normal mood and affect. Her behavior is normal. Judgment and thought content normal.     L buttock abscess - large 3x5 cm, very tender - confirmed w/POC [place]  Procedure note:  Incision and Drainage of an Abscess   Indication : a localized collection  of pus that is tender and not spontaneously resolving.    Risks including unsuccessful procedure , possible need for a repeat procedure due to pus accumulation, scar formation, and others as well as benefits were explained to the patient in detail. Written consent was obtained/signed.    The patient was placed in a decubitus position. The area of an abscess was prepped with povidone-iodine and draped in a sterile fashion. Local anesthesia with  3 cc of 2%  lidocaine and epinephrine  was administered - painful.  1.2 cm incision with #11strait blade was made. About 4 cc of purulent material was expressed. The abscess cavity was explored with a sterile hemostat and the walled- off pockets and septae were broken down bluntly. The cavity was irrigated with the rest of the anesthetic in the syringe and packed with 2-3 inches of  the iodoform gauze.   The wound was dressed with antibiotic ointment and Telfa pad.  Tolerated well. Complications: None.   Wound instructions provided.   Wound instructions : change dressing once a day or twice a day is needed. Change dressing after  shower in the morning.  Pat dry the wound with gauze. Pull out one inch of packing everyday and cut it off. Re-dress wound with antibiotic ointment and Telfa pad or a Band-Aid of appropriate size.   Please contact us if you notice a recollection of pus in the abscess fever and chills increased pain redness red streaks near the abscess increased swelling in the area.        Lab Results  Component Value Date   WBC 4.3 04/28/2017   HGB 15.1 (H) 04/28/2017   HCT 44.6 04/28/2017   PLT 222.0 04/28/2017   GLUCOSE 89 04/28/2017   CHOL 177 04/28/2017   TRIG 65.0 04/28/2017   HDL 90.80 04/28/2017   LDLCALC 73 04/28/2017   ALT 15 04/28/2017   AST 21 04/28/2017   NA 138 04/28/2017   K 4.0 04/28/2017   CL 102 04/28/2017   CREATININE 0.60 04/28/2017   BUN 14 04/28/2017   CO2 29 04/28/2017   TSH 1.30 04/28/2017    No results found.  Assessment & Plan:   There are no diagnoses linked to this encounter.   No orders of the defined types were placed in this encounter.    Follow-up: No follow-ups on file.  Sonda Primes, MD

## 2018-04-02 NOTE — Assessment & Plan Note (Addendum)
See Procedure Doxy po, Bactroban Ibuprofen for pain RTC if problems

## 2018-05-26 ENCOUNTER — Other Ambulatory Visit (INDEPENDENT_AMBULATORY_CARE_PROVIDER_SITE_OTHER): Payer: 59

## 2018-05-26 ENCOUNTER — Ambulatory Visit (INDEPENDENT_AMBULATORY_CARE_PROVIDER_SITE_OTHER): Payer: 59 | Admitting: Internal Medicine

## 2018-05-26 ENCOUNTER — Encounter: Payer: Self-pay | Admitting: Internal Medicine

## 2018-05-26 VITALS — BP 116/70 | HR 67 | Temp 98.1°F | Ht 65.0 in | Wt 119.0 lb

## 2018-05-26 DIAGNOSIS — Z0000 Encounter for general adult medical examination without abnormal findings: Secondary | ICD-10-CM

## 2018-05-26 DIAGNOSIS — Z7185 Encounter for immunization safety counseling: Secondary | ICD-10-CM

## 2018-05-26 DIAGNOSIS — Z7189 Other specified counseling: Secondary | ICD-10-CM | POA: Diagnosis not present

## 2018-05-26 LAB — LIPID PANEL
CHOL/HDL RATIO: 2
Cholesterol: 206 mg/dL — ABNORMAL HIGH (ref 0–200)
HDL: 83.8 mg/dL (ref >39.00)
LDL CALC: 108 mg/dL — AB (ref 0–99)
NONHDL: 122.53
TRIGLYCERIDES: 75 mg/dL (ref 0.0–149.0)
VLDL: 15 mg/dL (ref 0.0–40.0)

## 2018-05-26 LAB — HEPATIC FUNCTION PANEL
ALT: 15 U/L (ref 0–35)
AST: 22 U/L (ref 0–37)
Albumin: 4.8 g/dL (ref 3.5–5.2)
Alkaline Phosphatase: 58 U/L (ref 39–117)
BILIRUBIN TOTAL: 0.6 mg/dL (ref 0.2–1.2)
Bilirubin, Direct: 0.1 mg/dL (ref 0.0–0.3)
Total Protein: 8 g/dL (ref 6.0–8.3)

## 2018-05-26 LAB — BASIC METABOLIC PANEL
BUN: 12 mg/dL (ref 6–23)
CHLORIDE: 100 meq/L (ref 96–112)
CO2: 28 meq/L (ref 19–32)
CREATININE: 0.64 mg/dL (ref 0.40–1.20)
Calcium: 10.2 mg/dL (ref 8.4–10.5)
GFR: 118.63 mL/min (ref >60.00)
GLUCOSE: 88 mg/dL (ref 70–99)
Potassium: 3.8 mEq/L (ref 3.5–5.1)
Sodium: 138 mEq/L (ref 135–145)

## 2018-05-26 LAB — CBC WITH DIFFERENTIAL/PLATELET
BASOS ABS: 0.1 10*3/uL (ref 0.0–0.1)
Basophils Relative: 1.1 % (ref 0.0–3.0)
EOS ABS: 0.1 10*3/uL (ref 0.0–0.7)
Eosinophils Relative: 2.1 % (ref 0.0–5.0)
HEMATOCRIT: 46.5 % — AB (ref 36.0–46.0)
Hemoglobin: 15.9 g/dL — ABNORMAL HIGH (ref 12.0–15.0)
LYMPHS ABS: 2.6 10*3/uL (ref 0.7–4.0)
LYMPHS PCT: 39.2 % (ref 12.0–46.0)
MCHC: 34.1 g/dL (ref 30.0–36.0)
MCV: 88.5 fl (ref 78.0–100.0)
Monocytes Absolute: 0.4 10*3/uL (ref 0.1–1.0)
Monocytes Relative: 6.5 % (ref 3.0–12.0)
NEUTROS ABS: 3.4 10*3/uL (ref 1.4–7.7)
NEUTROS PCT: 51.1 % (ref 43.0–77.0)
PLATELETS: 215 10*3/uL (ref 150.0–400.0)
RBC: 5.26 Mil/uL — ABNORMAL HIGH (ref 3.87–5.11)
RDW: 12.5 % (ref 11.5–15.5)
WBC: 6.6 10*3/uL (ref 4.0–10.5)

## 2018-05-26 LAB — URINALYSIS
BILIRUBIN URINE: NEGATIVE
Hgb urine dipstick: NEGATIVE
KETONES UR: NEGATIVE
LEUKOCYTES UA: NEGATIVE
Nitrite: NEGATIVE
Specific Gravity, Urine: <=1.005 — AB (ref 1.000–1.030)
Total Protein, Urine: NEGATIVE
UROBILINOGEN UA: 0.2 (ref 0.0–1.0)
Urine Glucose: NEGATIVE
pH: 7 (ref 5.0–8.0)

## 2018-05-26 LAB — TSH: TSH: 1.77 u[IU]/mL (ref 0.35–4.50)

## 2018-05-26 NOTE — Progress Notes (Signed)
Subjective:  Patient ID: Theresa Carlson, female    DOB: 08/06/1990  Age: 27 y.o. MRN: [id number]  CC: No chief complaint on file.   HPI Nare E Padgett presents for a well exam  Outpatient Medications Prior to Visit  Medication Sig Dispense Refill  . Digestive Aids Mixture (DIGESTION GB PO) Take by mouth.    . Multiple Vitamins-Minerals (WOMENS MULTI PO) Take by mouth.    . diclofenac sodium (VOLTAREN) 1 % GEL Apply 2-4 g topically 4 (four) times daily. 100 g 1  . doxycycline (VIBRA-TABS) 100 MG tablet Take 1 tablet (100 mg total) by mouth 2 (two) times daily. (Patient not taking: Reported on 05/26/2018) 20 tablet 0  . FOLIC ACID PO Take by mouth.    . Grapefruit Oil OIL by Does not apply route.    Marland Kitchen. ibuprofen (ADVIL,MOTRIN) 600 MG tablet Take tid prn pain 60 tablet 0  . mupirocin ointment (BACTROBAN) 2 % Use bid 30 g 0  . TURMERIC PO Take by mouth.    . Vitamin D, Ergocalciferol, (DRISDOL) 50000 units CAPS capsule Take 1 capsule (50,000 Units total) by mouth every 7 (seven) days. 12 capsule 0   No facility-administered medications prior to visit.     ROS: Review of Systems  Constitutional: Negative for activity change, appetite change, chills, fatigue and unexpected weight change.  HENT: Negative for congestion, mouth sores and sinus pressure.   Eyes: Negative for visual disturbance.  Respiratory: Negative for cough and chest tightness.   Gastrointestinal: Negative for abdominal pain and nausea.  Genitourinary: Negative for difficulty urinating, frequency and vaginal pain.  Musculoskeletal: Negative for back pain and gait problem.  Skin: Negative for pallor and rash.  Neurological: Negative for dizziness, tremors, weakness, numbness and headaches.  Psychiatric/Behavioral: Negative for confusion, sleep disturbance and suicidal ideas.    Objective:  BP 116/70 (BP Location: Left Arm, Patient Position: Sitting, Cuff Size: Normal)   Pulse 67   Temp 98.1 F (36.7 C) (Oral)   Ht  5\' 5"  (1.651 m)   Wt 119 lb (54 kg)   SpO2 99%   BMI 19.80 kg/m   BP Readings from Last 3 Encounters:  05/26/18 116/70  04/02/18 116/78  02/13/18 110/82    Wt Readings from Last 3 Encounters:  05/26/18 119 lb (54 kg)  04/02/18 116 lb (52.6 kg)  02/13/18 118 lb (53.5 kg)    Physical Exam  Constitutional: She appears well-developed. No distress.  HENT:  Head: Normocephalic.  Right Ear: External ear normal.  Left Ear: External ear normal.  Nose: Nose normal.  Mouth/Throat: Oropharynx is clear and moist.  Eyes: Pupils are equal, round, and reactive to light. Conjunctivae are normal. Right eye exhibits no discharge. Left eye exhibits no discharge.  Neck: Normal range of motion. Neck supple. No JVD present. No tracheal deviation present. No thyromegaly present.  Cardiovascular: Normal rate, regular rhythm and normal heart sounds.  Pulmonary/Chest: No stridor. No respiratory distress. She has no wheezes.  Abdominal: Soft. Bowel sounds are normal. She exhibits no distension and no mass. There is no tenderness. There is no rebound and no guarding.  Musculoskeletal: She exhibits no edema or tenderness.  Lymphadenopathy:    She has no cervical adenopathy.  Neurological: She displays normal reflexes. No cranial nerve deficit. She exhibits normal muscle tone. Coordination normal.  Skin: No rash noted. No erythema.  Psychiatric: She has a normal mood and affect. Her behavior is normal. Judgment and thought content normal.  Lab Results  Component Value Date   WBC 4.3 04/28/2017   HGB 15.1 (H) 04/28/2017   HCT 44.6 04/28/2017   PLT 222.0 04/28/2017   GLUCOSE 89 04/28/2017   CHOL 177 04/28/2017   TRIG 65.0 04/28/2017   HDL 90.80 04/28/2017   LDLCALC 73 04/28/2017   ALT 15 04/28/2017   AST 21 04/28/2017   NA 138 04/28/2017   K 4.0 04/28/2017   CL 102 04/28/2017   CREATININE 0.60 04/28/2017   BUN 14 04/28/2017   CO2 29 04/28/2017   TSH 1.30 04/28/2017    No results  found.  Assessment & Plan:   There are no diagnoses linked to this encounter.   No orders of the defined types were placed in this encounter.    Follow-up: No follow-ups on file.  Sonda Primes, MD

## 2018-05-26 NOTE — Assessment & Plan Note (Addendum)
We discussed age appropriate health related issues, including available/recomended screening tests and vaccinations. We discussed a need for adhering to healthy diet and exercise. Labs were ordered to be later reviewed . All questions were answered. Form for shcool filled out  Gold TB test and varicella IgG ordered

## 2018-05-29 LAB — QUANTIFERON-TB GOLD PLUS
NIL: 0.02 [IU]/mL
QUANTIFERON-TB GOLD PLUS: NEGATIVE
TB1-NIL: 0 IU/mL
TB2-NIL: 0 IU/mL

## 2018-06-03 ENCOUNTER — Other Ambulatory Visit: Payer: 59

## 2018-06-03 DIAGNOSIS — Z7189 Other specified counseling: Secondary | ICD-10-CM | POA: Diagnosis not present

## 2018-06-03 DIAGNOSIS — Z0000 Encounter for general adult medical examination without abnormal findings: Secondary | ICD-10-CM | POA: Diagnosis not present

## 2018-06-03 DIAGNOSIS — Z7185 Encounter for immunization safety counseling: Secondary | ICD-10-CM

## 2018-06-03 NOTE — Addendum Note (Signed)
Addended by: Alonna MiniumWILLIAMS, Malayja Freund P on: 06/03/2018 11:57 AM   Modules accepted: Orders

## 2018-06-04 LAB — VARICELLA ZOSTER ANTIBODY, IGG: Varicella IgG: 1362 index

## 2018-07-09 ENCOUNTER — Telehealth: Payer: No Typology Code available for payment source | Admitting: Family

## 2018-07-09 DIAGNOSIS — J029 Acute pharyngitis, unspecified: Secondary | ICD-10-CM | POA: Diagnosis not present

## 2018-07-09 NOTE — Progress Notes (Signed)
Thank you for the details you included in the comment boxes. Those details are very helpful in determining the best course of treatment for you and help us to provide the best care.  We are sorry that you are not feeling well.  Here is how we plan to help!  Your symptoms indicate a likely viral infection (Pharyngitis).   Pharyngitis is inflammation in the back of the throat which can cause a sore throat, scratchiness and sometimes difficulty swallowing.   Pharyngitis is typically caused by a respiratory virus and will just run its course.  Please keep in mind that your symptoms could last up to 10 days.  For throat pain, we recommend over the counter oral pain relief medications such as acetaminophen or aspirin, or anti-inflammatory medications such as ibuprofen or naproxen sodium.  Topical treatments such as oral throat lozenges or sprays may be used as needed.  Avoid close contact with loved ones, especially the very young and elderly.  Remember to wash your hands thoroughly throughout the day as this is the number one way to prevent the spread of infection and wipe down door knobs and counters with disinfectant.  After careful review of your answers, I would not recommend and antibiotic for your condition.  Antibiotics should not be used to treat conditions that we suspect are caused by viruses like the virus that causes the common cold or flu. However, some people can have Strep with atypical symptoms. You may need formal testing in clinic or office to confirm if your symptoms continue or worsen.  Providers prescribe antibiotics to treat infections caused by bacteria. Antibiotics are very powerful in treating bacterial infections when they are used properly.  To maintain their effectiveness, they should be used only when necessary.  Overuse of antibiotics has resulted in the development of super bugs that are resistant to treatment!    Home Care:  Only take medications as instructed by your medical  team.  Do not drink alcohol while taking these medications.  A steam or ultrasonic humidifier can help congestion.  You can place a towel over your head and breathe in the steam from hot water coming from a faucet.  Avoid close contacts especially the very young and the elderly.  Cover your mouth when you cough or sneeze.  Always remember to wash your hands.  Get Help Right Away If:  You develop worsening fever or throat pain.  You develop a severe head ache or visual changes.  Your symptoms persist after you have completed your treatment plan.  Make sure you  Understand these instructions.  Will watch your condition.  Will get help right away if you are not doing well or get worse.  Your e-visit answers were reviewed by a board certified advanced clinical practitioner to complete your personal care plan.  Depending on the condition, your plan could have included both over the counter or prescription medications.  If there is a problem please reply  once you have received a response from your provider.  Your safety is important to us.  If you have drug allergies check your prescription carefully.    You can use MyChart to ask questions about todays visit, request a non-urgent call back, or ask for a work or school excuse for 24 hours related to this e-Visit. If it has been greater than 24 hours you will need to follow up with your provider, or enter a new e-Visit to address those concerns.  You will get an e-mail   in the next two days asking about your experience.  I hope that your e-visit has been valuable and will speed your recovery. Thank you for using e-visits.    

## 2018-07-14 ENCOUNTER — Ambulatory Visit (INDEPENDENT_AMBULATORY_CARE_PROVIDER_SITE_OTHER): Payer: Self-pay | Admitting: Nurse Practitioner

## 2018-07-14 VITALS — BP 112/68 | HR 78 | Temp 97.8°F | Wt 120.4 lb

## 2018-07-14 DIAGNOSIS — J029 Acute pharyngitis, unspecified: Secondary | ICD-10-CM

## 2018-07-14 DIAGNOSIS — J040 Acute laryngitis: Secondary | ICD-10-CM

## 2018-07-14 MED ORDER — PSEUDOEPH-BROMPHEN-DM 30-2-10 MG/5ML PO SYRP: 5.0000 mL | ORAL_SOLUTION | Freq: Four times a day (QID) | ORAL | 0 refills | Status: AC | PRN

## 2018-07-14 MED ORDER — PREDNISONE 10 MG PO TABS: 40.0000 mg | ORAL_TABLET | Freq: Every day | ORAL | 0 refills | Status: AC

## 2018-07-14 NOTE — Patient Instructions (Signed)
Laryngitis -Take medication as prescribed. -Ibuprofen or Tylenol for pain, fever, or general discomfort. -Increase fluids. -Sleep elevated on at least 2 pillows at bedtime to help with cough. -Use a humidifier or vaporizer when at home and during sleep to help with cough. -May use a teaspoon of honey as needed for throat pain or discomfort. -Warm saltwater gargles 3-4 times daily for throat pain. -Rest your voice as much as possible. -May use a teaspoon of honey or over-the-counter cough drops to help with cough. -Follow-up if symptoms do not improve.  Laryngitis is inflammation of the vocal cords that causes symptoms such as hoarseness or loss of voice. The vocal cords are two bands of muscles in your throat. When you speak, these cords come together and vibrate. The vibrations come out through your mouth as sound. When your vocal cords are inflamed, your voice sounds different. Laryngitis can be temporary (acute) or long-term (chronic). Most cases of acute laryngitis improve with time. Chronic laryngitis is laryngitis that lasts for more than 3 weeks. What are the causes? Acute laryngitis may be caused by:  A viral infection.  Lots of talking, yelling, or singing. This is also called vocal strain.  A bacterial infection. Chronic laryngitis may be caused by:  Vocal strain.  Injury to your vocal cords.  Acid reflux (gastroesophageal reflux disease, or GERD).  Allergies.  A sinus infection.  Smoking.  Alcohol abuse.  Breathing in chemicals or dust.  Growths on the vocal cords. What increases the risk? The following factors may make you more likely to develop this condition:  Smoking.  Alcohol abuse.  Having allergies.  Chronic irritants in the workplace, such as toxic fumes. What are the signs or symptoms? Symptoms of this condition may include:  Low, hoarse voice.  Loss of voice.  Dry cough.  Sore or dry throat.  Stuffy nose. How is this diagnosed? This  condition may be diagnosed based on:  Your symptoms and a physical exam.  Throat culture.  Blood test.  A procedure in which your health care provider looks at your vocal cords with a mirror or viewing tube (laryngoscopy). How is this treated? Treatment for laryngitis depends on what is causing it.  Usually, treatment involves resting your voice and using medicines to soothe your throat.  If your laryngitis is caused by a bacterial infection, you may need to take antibiotic medicine.  If your laryngitis is caused by a growth, you may need to have a procedure to remove it. Follow these instructions at home: Medicines  Take over-the-counter and prescription medicines only as told by your health care provider.  If you were prescribed an antibiotic medicine, take it as told by your health care provider. Do not stop taking the antibiotic even if you start to feel better. General instructions  Talk as little as possible. Also avoid whispering, which can cause vocal strain.  Write instead of talking. Do this until your voice is back to normal.  Drink enough fluid to keep your urine pale yellow.  Breathe in moist air. Use a humidifier if you live in a dry climate.  Do not use any products that contain nicotine or tobacco, such as cigarettes and e-cigarettes. If you need help quitting, ask your health care provider. Contact a health care provider if:  You have a fever.  You have increasing pain.  Your symptoms do not get better in 2 weeks. Get help right away if:  You cough up blood.  You have difficulty swallowing.  You have trouble breathing. Summary  Laryngitis is inflammation of the vocal cords that causes symptoms such as hoarseness or loss of voice.  Laryngitis can be temporary (acute) or long-term (chronic).  Treatment for laryngitis depends on the cause. It often involves resting your voice and using medicine to soothe your throat. This information is not  intended to replace advice given to you by your health care provider. Make sure you discuss any questions you have with your health care provider. Document Released: 06/17/2005 Document Revised: 06/04/2017 Document Reviewed: 06/04/2017 Elsevier Interactive Patient Education  2019 [employer]  Pharyngitis  Pharyngitis is redness, pain, and swelling (inflammation) of the throat (pharynx). It is a very common cause of sore throat. Pharyngitis can be caused by a bacteria, but it is usually caused by a virus. Most cases of pharyngitis get better on their own without treatment. What are the causes? This condition may be caused by:  Infection by viruses (viral). Viral pharyngitis spreads from person to person (is contagious) through coughing, sneezing, and sharing of personal items or utensils such as cups, forks, spoons, and toothbrushes.  Infection by bacteria (bacterial). Bacterial pharyngitis may be spread by touching the nose or face after coming in contact with the bacteria, or through more intimate contact, such as kissing.  Allergies. Allergies can cause buildup of mucus in the throat (post-nasal drip), leading to inflammation and irritation. Allergies can also cause blocked nasal passages, forcing breathing through the mouth, which dries and irritates the throat. What increases the risk? You are more likely to develop this condition if:  You are 64-64 years old.  You are exposed to crowded environments such as daycare, school, or dormitory living.  You live in a cold climate.  You have a weakened disease-fighting (immune) system. What are the signs or symptoms? Symptoms of this condition vary by the cause (viral, bacterial, or allergies) and can include:  Sore throat.  Fatigue.  Low-grade fever.  Headache.  Joint pain and muscle aches.  Skin rashes.  Swollen glands in the throat (lymph nodes).  Plaque-like film on the throat or tonsils. This is often a symptom of  bacterial pharyngitis.  Vomiting.  Stuffy nose (nasal congestion).  Cough.  Red, itchy eyes (conjunctivitis).  Loss of appetite. How is this diagnosed? This condition is often diagnosed based on your medical history and a physical exam. Your health care provider will ask you questions about your illness and your symptoms. A swab of your throat may be done to check for bacteria (rapid strep test). Other lab tests may also be done, depending on the suspected cause, but these are rare. How is this treated? This condition usually gets better in 3-4 days without medicine. Bacterial pharyngitis may be treated with antibiotic medicines. Follow these instructions at home:  Take over-the-counter and prescription medicines only as told by your health care provider. ? If you were prescribed an antibiotic medicine, take it as told by your health care provider. Do not stop taking the antibiotic even if you start to feel better. ? Do not give children aspirin because of the association with Reye syndrome.  Drink enough water and fluids to keep your urine clear or pale yellow.  Get a lot of rest.  Gargle with a salt-water mixture 3-4 times a day or as needed. To make a salt-water mixture, completely dissolve -1 tsp of salt in 1 cup of warm water.  If your health care provider approves, you may use throat lozenges or sprays to  soothe your throat. Contact a health care provider if:  You have large, tender lumps in your neck.  You have a rash.  You cough up green, yellow-brown, or bloody spit. Get help right away if:  Your neck becomes stiff.  You drool or are unable to swallow liquids.  You cannot drink or take medicines without vomiting.  You have severe pain that does not go away, even after you take medicine.  You have trouble breathing, and it is not caused by a stuffy nose.  You have new pain and swelling in your joints such as the knees, ankles, wrists, or  elbows. Summary  Pharyngitis is redness, pain, and swelling (inflammation) of the throat (pharynx).  While pharyngitis can be caused by a bacteria, the most common causes are viral.  Most cases of pharyngitis get better on their own without treatment.  Bacterial pharyngitis is treated with antibiotic medicines. This information is not intended to replace advice given to you by your health care provider. Make sure you discuss any questions you have with your health care provider. Document Released: 06/17/2005 Document Revised: 07/23/2016 Document Reviewed: 07/23/2016 Elsevier Interactive Patient Education  2019 [employer].

## 2018-07-14 NOTE — Progress Notes (Signed)
Subjective:     Theresa Carlson is a 28 y.o. female who presents for evaluation of sore throat. Associated symptoms include hoarseness, nasal blockage, post nasal drip, productive cough and sore throat. Onset of symptoms was 7 days ago, and have been gradually improving since that time.  Patient states when her symptoms started her throat pain was 9/10, patient states today her pain is 5/10.  Patient also states at the onset of her initial symptoms her cough was productive with a yellowish-green/brown sputum. Patient informs she lost her voice on yesterday.   She is drinking plenty of fluids. She has not had a recent close exposure to someone with proven streptococcal pharyngitis.  The following portions of the patient's history were reviewed and updated as appropriate: allergies, current medications and past medical history.  Review of Systems Constitutional: positive for fatigue, negative for anorexia, chills, malaise and sweats Eyes: negative Ears, nose, mouth, throat, and face: positive for hoarseness, nasal congestion and sore throat, negative for ear drainage and earaches Respiratory: positive for cough, sputum and wheezing, negative for asthma, chronic bronchitis, hemoptysis, pleurisy/chest pain and pneumonia Cardiovascular: negative Gastrointestinal: negative Neurological: negative    Objective:    BP 112/68 (BP Location: Right Arm, Patient Position: Sitting, Cuff Size: Normal)   Pulse 78   Temp 97.8 F (36.6 C) (Oral)   Wt 120 lb 6.4 oz (54.6 kg)   SpO2 98%   BMI 20.04 kg/m  General appearance: alert, cooperative, fatigued and no distress Head: Normocephalic, without obvious abnormality, atraumatic Eyes: conjunctivae/corneas clear. PERRL, EOM's intact. Fundi benign. Ears: normal TM's and external ear canals both ears Nose: no discharge, mild congestion, turbinates swollen, inflamed, no sinus tenderness Throat: abnormal findings: moderate oropharyngeal erythema and no  oropharyngeal edema, tonsils +1 bilaterally, no exudates present, uvula midline, no airway compromise Lungs: clear to auscultation bilaterally Heart: regular rate and rhythm, S1, S2 normal, no murmur, click, rub or gallop Abdomen: soft, non-tender; bowel sounds normal; no masses,  no organomegaly Pulses: 2+ and symmetric Skin: Skin color, texture, turgor normal. No rashes or lesions Lymph nodes: cervical and submandibular nodes normal Neurologic: Grossly normal  Laboratory Strep test not done. Results:n/a.    Assessment and Plan:    Acute pharyngitis, likely  Viral pharyngitis.    Exam findings, diagnosis etiology and medication use and indications reviewed with patient. Follow- Up and discharge instructions provided. No emergent/urgent issues found on exam.  At this point, I think patient symptoms are of viral etiology.  Based on the patient's clinical presentation, absence of fever, and physical assessment, would like to continue symptomatic treatment for the patient.  Patient will be prescribed prednisone, and cough medicine to help with her symptoms.  Informed patient that if symptoms do not improve within the next 3 to 5 days to contact our office.  Patient education was provided. Patient verbalized understanding of information provided and agrees with plan of care (POC), all questions answered. The patient is advised to call or return to clinic if condition does not see an improvement in symptoms, or to seek the care of the closest emergency department if condition worsens with the above plan.   1. Acute pharyngitis, unspecified etiology  - brompheniramine-pseudoephedrine-DM 30-2-10 MG/5ML syrup; Take 5 mLs by mouth 4 (four) times daily as needed for up to 7 days.  Dispense: 150 mL; Refill: 0 - predniSONE (DELTASONE) 10 MG tablet; Take 4 tablets (40 mg total) by mouth daily with breakfast for 5 days.  Dispense: 20 tablet; Refill:  0 -Take medication as prescribed. -Ibuprofen or Tylenol  for pain, fever, or general discomfort. -Increase fluids. -Sleep elevated on at least 2 pillows at bedtime to help with cough. -Use a humidifier or vaporizer when at home and during sleep to help with cough. -May use a teaspoon of honey as needed for throat pain or discomfort. -Warm saltwater gargles 3-4 times daily for throat pain. -Rest your voice as much as possible. -May use a teaspoon of honey or over-the-counter cough drops to help with cough. -Follow-up if symptoms do not improve.   2. Acute laryngitis  - brompheniramine-pseudoephedrine-DM 30-2-10 MG/5ML syrup; Take 5 mLs by mouth 4 (four) times daily as needed for up to 7 days.  Dispense: 150 mL; Refill: 0 - predniSONE (DELTASONE) 10 MG tablet; Take 4 tablets (40 mg total) by mouth daily with breakfast for 5 days.  Dispense: 20 tablet; Refill: 0 -Take medication as prescribed. -Ibuprofen or Tylenol for pain, fever, or general discomfort. -Increase fluids. -Sleep elevated on at least 2 pillows at bedtime to help with cough. -Use a humidifier or vaporizer when at home and during sleep to help with cough. -May use a teaspoon of honey as needed for throat pain or discomfort. -Warm saltwater gargles 3-4 times daily for throat pain. -Rest your voice as much as possible. -May use a teaspoon of honey or over-the-counter cough drops to help with cough. -Follow-up if symptoms do not improve.

## 2018-07-16 ENCOUNTER — Telehealth: Payer: Self-pay

## 2018-07-16 NOTE — Telephone Encounter (Signed)
Patient states she is doing well.

## 2018-08-13 ENCOUNTER — Encounter: Payer: Self-pay | Admitting: Internal Medicine

## 2018-08-13 ENCOUNTER — Ambulatory Visit (INDEPENDENT_AMBULATORY_CARE_PROVIDER_SITE_OTHER): Payer: No Typology Code available for payment source | Admitting: Internal Medicine

## 2018-08-13 DIAGNOSIS — Z833 Family history of diabetes mellitus: Secondary | ICD-10-CM | POA: Insufficient documentation

## 2018-08-13 DIAGNOSIS — E785 Hyperlipidemia, unspecified: Secondary | ICD-10-CM | POA: Insufficient documentation

## 2018-08-13 NOTE — Assessment & Plan Note (Signed)
A1c

## 2018-08-13 NOTE — Progress Notes (Signed)
Established Patient Office Visit  Subjective:  Patient ID: Theresa Carlson, female    DOB: 10/10/1990  Age: 28 y.o. MRN: [id number]  CC: No chief complaint on file.   HPI Charlotte E Catlin presents for elevated lipids and fam history of DM  Past Medical History:  Diagnosis Date  . Asthma    as a child    Past Surgical History:  Procedure Laterality Date  . trigger thumb      Family History  Problem Relation Age of Onset  . Hypertension Mother   . Eczema Mother   . Hypertension Father   . Stroke Maternal Grandfather   . Prostate cancer Maternal Grandfather   . Breast cancer Paternal Grandmother   . Lung cancer Paternal Grandfather     Social History   Socioeconomic History  . Marital status: Married    Spouse name: Not on file  . Number of children: Not on file  . Years of education: Not on file  . Highest education level: Not on file  Occupational History  . Occupation: Teacher, adult educationN    Employer: [employer]  Social Needs  . Financial resource strain: Not on file  . Food insecurity:    Worry: Not on file    Inability: Not on file  . Transportation needs:    Medical: Not on file    Non-medical: Not on file  Tobacco Use  . Smoking status: Never Smoker  . Smokeless tobacco: Never Used  Substance and Sexual Activity  . Alcohol use: Yes    Alcohol/week: 1.0 standard drinks    Types: 1 Glasses of wine per week    Comment: occasonally   . Drug use: No  . Sexual activity: Not on file  Lifestyle  . Physical activity:    Days per week: Not on file    Minutes per session: Not on file  . Stress: Not on file  Relationships  . Social connections:    Talks on phone: Not on file    Gets together: Not on file    Attends religious service: Not on file    Active member of club or organization: Not on file    Attends meetings of clubs or organizations: Not on file    Relationship status: Not on file  . Intimate partner violence:    Fear of current or ex partner: Not on file    Emotionally abused: Not on file    Physically abused: Not on file    Forced sexual activity: Not on file  Other Topics Concern  . Not on file  Social History Narrative  . Not on file    Outpatient Medications Prior to Visit  Medication Sig Dispense Refill  . Digestive Aids Mixture (DIGESTION GB PO) Take by mouth.    . Folic Acid (FOLATE PO) folic acid    . Multiple Vitamins-Minerals (WOMENS MULTI PO) Take by mouth.    Marland Kitchen. UNABLE TO FIND Med Name: essential oils (metabolic blend)     No facility-administered medications prior to visit.     No Known Allergies  ROS Review of Systems  Constitutional: Negative for activity change, appetite change, chills, fatigue and unexpected weight change.  HENT: Negative for congestion, mouth sores and sinus pressure.   Eyes: Negative for visual disturbance.  Respiratory: Negative for cough and chest tightness.   Gastrointestinal: Negative for abdominal pain and nausea.  Genitourinary: Negative for difficulty urinating, frequency and vaginal pain.  Musculoskeletal: Negative for back pain and gait problem.  Skin: Negative for pallor and rash.  Neurological: Negative for dizziness, tremors, weakness, numbness and headaches.  Psychiatric/Behavioral: Negative for confusion, sleep disturbance and suicidal ideas.      Objective:    Physical Exam  Constitutional: She appears well-developed. No distress.  HENT:  Head: Normocephalic.  Right Ear: External ear normal.  Left Ear: External ear normal.  Nose: Nose normal.  Mouth/Throat: Oropharynx is clear and moist.  Eyes: Pupils are equal, round, and reactive to light. Conjunctivae are normal. Right eye exhibits no discharge. Left eye exhibits no discharge.  Neck: Normal range of motion. Neck supple. No JVD present. No tracheal deviation present. No thyromegaly present.  Cardiovascular: Normal rate, regular rhythm and normal heart sounds.  Pulmonary/Chest: No stridor. No respiratory distress. She  has no wheezes.  Abdominal: Soft. Bowel sounds are normal. She exhibits no distension and no mass. There is no abdominal tenderness. There is no rebound and no guarding.  Musculoskeletal:        General: No tenderness or edema.  Lymphadenopathy:    She has no cervical adenopathy.  Neurological: She displays normal reflexes. No cranial nerve deficit. She exhibits normal muscle tone. Coordination normal.  Skin: No rash noted. No erythema.  Psychiatric: She has a normal mood and affect. Her behavior is normal. Judgment and thought content normal.    BP 114/64 (BP Location: Left Arm, Patient Position: Sitting, Cuff Size: Normal)   Pulse 65   Temp 98.4 F (36.9 C) (Oral)   Ht 5\' 5"  (1.651 m)   Wt 121 lb (54.9 kg)   SpO2 99%   BMI 20.14 kg/m  Wt Readings from Last 3 Encounters:  08/13/18 121 lb (54.9 kg)  07/14/18 120 lb 6.4 oz (54.6 kg)  05/26/18 119 lb (54 kg)     Health Maintenance Due  Topic Date Due  . HIV Screening  10/03/2006  . TETANUS/TDAP  10/03/2010  . PAP-Cervical Cytology Screening  10/02/2012  . PAP SMEAR-Modifier  10/02/2012    There are no preventive care reminders to display for this patient.  Lab Results  Component Value Date   TSH 1.77 05/26/2018   Lab Results  Component Value Date   WBC 6.6 05/26/2018   HGB 15.9 (H) 05/26/2018   HCT 46.5 (H) 05/26/2018   MCV 88.5 05/26/2018   PLT 215.0 05/26/2018   Lab Results  Component Value Date   NA 138 05/26/2018   K 3.8 05/26/2018   CO2 28 05/26/2018   GLUCOSE 88 05/26/2018   BUN 12 05/26/2018   CREATININE 0.64 05/26/2018   BILITOT 0.6 05/26/2018   ALKPHOS 58 05/26/2018   AST 22 05/26/2018   ALT 15 05/26/2018   PROT 8.0 05/26/2018   ALBUMIN 4.8 05/26/2018   CALCIUM 10.2 05/26/2018   GFR 118.63 05/26/2018   Lab Results  Component Value Date   CHOL 206 (H) 05/26/2018   Lab Results  Component Value Date   HDL 83.80 05/26/2018   Lab Results  Component Value Date   LDLCALC 108 (H) 05/26/2018     Lab Results  Component Value Date   TRIG 75.0 05/26/2018   Lab Results  Component Value Date   CHOLHDL 2 05/26/2018   No results found for: HGBA1C    Assessment & Plan:   Problem List Items Addressed This Visit    None      No orders of the defined types were placed in this encounter.   Follow-up: No follow-ups on file.    Alex ,  MD

## 2018-08-13 NOTE — Assessment & Plan Note (Signed)
Labs

## 2018-08-18 ENCOUNTER — Other Ambulatory Visit (INDEPENDENT_AMBULATORY_CARE_PROVIDER_SITE_OTHER): Payer: No Typology Code available for payment source

## 2018-08-18 DIAGNOSIS — E785 Hyperlipidemia, unspecified: Secondary | ICD-10-CM

## 2018-08-18 LAB — LIPID PANEL
CHOL/HDL RATIO: 2
Cholesterol: 165 mg/dL (ref 0–200)
HDL: 70.6 mg/dL (ref >39.00)
LDL CALC: 85 mg/dL (ref 0–99)
NonHDL: 94.06
Triglycerides: 47 mg/dL (ref 0.0–149.0)
VLDL: 9.4 mg/dL (ref 0.0–40.0)

## 2018-08-18 LAB — BASIC METABOLIC PANEL
BUN: 15 mg/dL (ref 6–23)
CHLORIDE: 104 meq/L (ref 96–112)
CO2: 25 mEq/L (ref 19–32)
CREATININE: 0.64 mg/dL (ref 0.40–1.20)
Calcium: 9 mg/dL (ref 8.4–10.5)
GFR: 111.42 mL/min (ref >60.00)
Glucose, Bld: 79 mg/dL (ref 70–99)
Potassium: 4.3 mEq/L (ref 3.5–5.1)
Sodium: 137 mEq/L (ref 135–145)

## 2018-08-18 LAB — HEMOGLOBIN A1C: Hgb A1c MFr Bld: 4.8 % (ref 4.6–6.5)

## 2019-02-11 ENCOUNTER — Encounter (HOSPITAL_COMMUNITY)
Admission: RE | Disposition: A | Discharge status: Home / Self Care | Payer: Self-pay | Source: Ambulatory Visit | Attending: Obstetrics and Gynecology

## 2019-02-11 ENCOUNTER — Ambulatory Visit (HOSPITAL_COMMUNITY)
Admission: RE | Admit: 2019-02-11 | Discharge: 2019-02-11 | Disposition: A | Discharge status: Home / Self Care | Payer: No Typology Code available for payment source | Source: Ambulatory Visit | Attending: Obstetrics and Gynecology | Admitting: Obstetrics and Gynecology

## 2019-02-11 ENCOUNTER — Encounter: Payer: Self-pay | Admitting: Obstetrics and Gynecology

## 2019-02-11 ENCOUNTER — Ambulatory Visit (HOSPITAL_COMMUNITY): Payer: No Typology Code available for payment source | Admitting: Anesthesiology

## 2019-02-11 DIAGNOSIS — Z20828 Contact with and (suspected) exposure to other viral communicable diseases: Secondary | ICD-10-CM | POA: Diagnosis not present

## 2019-02-11 DIAGNOSIS — O021 Missed abortion: Secondary | ICD-10-CM | POA: Diagnosis present

## 2019-02-11 DIAGNOSIS — Z9889 Other specified postprocedural states: Secondary | ICD-10-CM

## 2019-02-11 HISTORY — DX: Missed abortion: O02.1

## 2019-02-11 HISTORY — PX: DILATION AND EVACUATION: SHX1459

## 2019-02-11 HISTORY — DX: Other specified postprocedural states: Z98.890

## 2019-02-11 LAB — CBC
HCT: 44.9 % (ref 36.0–46.0)
Hemoglobin: 15.5 g/dL — ABNORMAL HIGH (ref 12.0–15.0)
MCH: 30.6 pg (ref 26.0–34.0)
MCHC: 34.5 g/dL (ref 30.0–36.0)
MCV: 88.7 fL (ref 80.0–100.0)
Platelets: 183 10*3/uL (ref 150–400)
RBC: 5.06 MIL/uL (ref 3.87–5.11)
RDW: 12.1 % (ref 11.5–15.5)
WBC: 7.2 10*3/uL (ref 4.0–10.5)
nRBC: 0 % (ref 0.0–0.2)

## 2019-02-11 LAB — ABO/RH: ABO/RH(D): A POS

## 2019-02-11 LAB — TYPE AND SCREEN
ABO/RH(D): A POS
Antibody Screen: NEGATIVE

## 2019-02-11 LAB — SARS CORONAVIRUS 2 BY RT PCR (HOSPITAL ORDER, PERFORMED IN ~~LOC~~ HOSPITAL LAB): SARS Coronavirus 2: NEGATIVE

## 2019-02-11 SURGERY — DILATION AND EVACUATION, UTERUS
Anesthesia: General

## 2019-02-11 MED ORDER — PROPOFOL 10 MG/ML IV BOLUS
INTRAVENOUS | Status: AC
  Filled 2019-02-11: qty 40

## 2019-02-11 MED ORDER — LIDOCAINE HCL 2 % IJ SOLN
INTRAMUSCULAR | Status: DC | PRN
  Administered 2019-02-11: 15 mL

## 2019-02-11 MED ORDER — SUCCINYLCHOLINE CHLORIDE 200 MG/10ML IV SOSY
PREFILLED_SYRINGE | INTRAVENOUS | Status: AC
  Filled 2019-02-11: qty 30

## 2019-02-11 MED ORDER — ONDANSETRON HCL 8 MG PO TABS: 8.0000 mg | ORAL_TABLET | Freq: Three times a day (TID) | ORAL | 0 refills | Status: DC | PRN

## 2019-02-11 MED ORDER — FENTANYL CITRATE (PF) 250 MCG/5ML IJ SOLN
INTRAMUSCULAR | Status: AC
  Filled 2019-02-11: qty 5

## 2019-02-11 MED ORDER — ONDANSETRON HCL 4 MG/2ML IJ SOLN
INTRAMUSCULAR | Status: DC | PRN
  Administered 2019-02-11: 4 mg via INTRAVENOUS

## 2019-02-11 MED ORDER — LACTATED RINGERS IV SOLN: INTRAVENOUS | Status: DC

## 2019-02-11 MED ORDER — MIDAZOLAM HCL 5 MG/5ML IJ SOLN
INTRAMUSCULAR | Status: DC | PRN
  Administered 2019-02-11: 2 mg via INTRAVENOUS

## 2019-02-11 MED ORDER — LIDOCAINE 2% (20 MG/ML) 5 ML SYRINGE
INTRAMUSCULAR | Status: AC
  Filled 2019-02-11: qty 10

## 2019-02-11 MED ORDER — FENTANYL CITRATE (PF) 100 MCG/2ML IJ SOLN: 25.0000 ug | INTRAMUSCULAR | Status: DC | PRN

## 2019-02-11 MED ORDER — FENTANYL CITRATE (PF) 250 MCG/5ML IJ SOLN
INTRAMUSCULAR | Status: DC | PRN
  Administered 2019-02-11 (×2): 50 ug via INTRAVENOUS

## 2019-02-11 MED ORDER — DOXYCYCLINE HYCLATE 100 MG PO TABS
200.0000 mg | ORAL_TABLET | Freq: Once | ORAL | Status: AC
  Administered 2019-02-11: 200 mg via ORAL
  Filled 2019-02-11: qty 2

## 2019-02-11 MED ORDER — DEXAMETHASONE SODIUM PHOSPHATE 10 MG/ML IJ SOLN
INTRAMUSCULAR | Status: AC
  Filled 2019-02-11: qty 1

## 2019-02-11 MED ORDER — LIDOCAINE HCL (CARDIAC) PF 100 MG/5ML IV SOSY
PREFILLED_SYRINGE | INTRAVENOUS | Status: DC | PRN
  Administered 2019-02-11: 60 mg via INTRATRACHEAL

## 2019-02-11 MED ORDER — LACTATED RINGERS IV SOLN
INTRAVENOUS | Status: DC | PRN
  Administered 2019-02-11: 21:00:00 via INTRAVENOUS

## 2019-02-11 MED ORDER — ARTIFICIAL TEARS OPHTHALMIC OINT
TOPICAL_OINTMENT | OPHTHALMIC | Status: AC
  Filled 2019-02-11: qty 3.5

## 2019-02-11 MED ORDER — PROMETHAZINE HCL 25 MG/ML IJ SOLN: 6.2500 mg | INTRAMUSCULAR | Status: DC | PRN

## 2019-02-11 MED ORDER — DOXYCYCLINE HYCLATE 100 MG PO TABS
100.0000 mg | ORAL_TABLET | Freq: Once | ORAL | Status: AC
  Administered 2019-02-11: 100 mg via ORAL
  Filled 2019-02-11: qty 1

## 2019-02-11 MED ORDER — MIDAZOLAM HCL 2 MG/2ML IJ SOLN
INTRAMUSCULAR | Status: AC
  Filled 2019-02-11: qty 2

## 2019-02-11 MED ORDER — ONDANSETRON HCL 4 MG/2ML IJ SOLN
INTRAMUSCULAR | Status: AC
  Filled 2019-02-11: qty 2

## 2019-02-11 MED ORDER — LIDOCAINE HCL 2 % IJ SOLN
INTRAMUSCULAR | Status: AC
  Filled 2019-02-11: qty 20

## 2019-02-11 MED ORDER — SUCCINYLCHOLINE CHLORIDE 20 MG/ML IJ SOLN
INTRAMUSCULAR | Status: DC | PRN
  Administered 2019-02-11: 80 mg via INTRAVENOUS

## 2019-02-11 MED ORDER — PROPOFOL 10 MG/ML IV BOLUS
INTRAVENOUS | Status: DC | PRN
  Administered 2019-02-11: 150 mg via INTRAVENOUS

## 2019-02-11 MED ORDER — DEXAMETHASONE SODIUM PHOSPHATE 10 MG/ML IJ SOLN
INTRAMUSCULAR | Status: DC | PRN
  Administered 2019-02-11: 10 mg via INTRAVENOUS

## 2019-02-11 SURGICAL SUPPLY — 20 items
CATH ROBINSON RED A/P 16FR (CATHETERS) ×3 IMPLANT
DECANTER SPIKE VIAL GLASS SM (MISCELLANEOUS) ×3 IMPLANT
GLOVE BIO SURGEON STRL SZ 6.5 (GLOVE) ×2 IMPLANT
GLOVE BIO SURGEONS STRL SZ 6.5 (GLOVE) ×1
GLOVE BIOGEL PI IND STRL 7.0 (GLOVE) ×1 IMPLANT
GLOVE BIOGEL PI INDICATOR 7.0 (GLOVE) ×2
GOWN STRL REUS W/ TWL LRG LVL3 (GOWN DISPOSABLE) ×2 IMPLANT
GOWN STRL REUS W/TWL LRG LVL3 (GOWN DISPOSABLE) ×4
HOSE CONNECTING 18IN BERKELEY (TUBING) ×3 IMPLANT
KIT BERKELEY 1ST TRIMESTER 3/8 (MISCELLANEOUS) ×6 IMPLANT
NS IRRIG 1000ML POUR BTL (IV SOLUTION) ×3 IMPLANT
PACK VAGINAL MINOR WOMEN LF (CUSTOM PROCEDURE TRAY) ×3 IMPLANT
PAD OB MATERNITY 4.3X12.25 (PERSONAL CARE ITEMS) ×3 IMPLANT
SET BERKELEY SUCTION TUBING (SUCTIONS) ×3 IMPLANT
TOWEL GREEN STERILE FF (TOWEL DISPOSABLE) ×6 IMPLANT
UNDERPAD 30X30 (UNDERPADS AND DIAPERS) ×3 IMPLANT
VACURETTE 10 RIGID CVD (CANNULA) ×3 IMPLANT
VACURETTE 7MM CVD STRL WRAP (CANNULA) IMPLANT
VACURETTE 8 RIGID CVD (CANNULA) IMPLANT
VACURETTE 9 RIGID CVD (CANNULA) IMPLANT

## 2019-02-11 NOTE — Interval H&P Note (Signed)
History and Physical Interval Note:  02/11/2019 8:01 PM  Theresa Carlson  has presented today for surgery, with the diagnosis of Missed Abortions.  The various methods of treatment have been discussed with the patient and family. After consideration of risks, benefits and other options for treatment, the patient has consented to  Procedure(s): DILATATION AND EVACUATION (N/A) as a surgical intervention.  The patient's history has been reviewed, patient examined, no change in status, stable for surgery.  I have reviewed the patient's chart and labs.  Questions were answered to the patient's satisfaction.     Italia Wolfert Bovard-Stuckert

## 2019-02-11 NOTE — Anesthesia Procedure Notes (Signed)
Procedure Name: Intubation Date/Time: 02/11/2019 9:00 PM Performed by: Clovis Cao, CRNA Pre-anesthesia Checklist: Patient identified, Emergency Drugs available, Suction available, Patient being monitored and Timeout performed Patient Re-evaluated:Patient Re-evaluated prior to induction Oxygen Delivery Method: Circle system utilized Preoxygenation: Pre-oxygenation with 100% oxygen Induction Type: IV induction, Rapid sequence and Cricoid Pressure applied Laryngoscope Size: Miller and 2 Grade View: Grade I Tube type: Oral Tube size: 7.0 mm Number of attempts: 1 Airway Equipment and Method: Stylet Placement Confirmation: ETT inserted through vocal cords under direct vision,  positive ETCO2 and breath sounds checked- equal and bilateral Secured at: 21 cm Tube secured with: Tape Dental Injury: Teeth and Oropharynx as per pre-operative assessment

## 2019-02-11 NOTE — Anesthesia Preprocedure Evaluation (Addendum)
Anesthesia Evaluation  Patient identified by MRN, date of birth, ID band Patient awake    Reviewed: Allergy & Precautions, NPO status , Patient's Chart, lab work & pertinent test results  Airway Mallampati: I  TM Distance: >3 FB Neck ROM: Full    Dental  (+) Teeth Intact, Dental Advisory Given   Pulmonary asthma ,    Pulmonary exam normal breath sounds clear to auscultation       Cardiovascular negative cardio ROS Normal cardiovascular exam Rhythm:Regular Rate:Normal     Neuro/Psych negative neurological ROS     GI/Hepatic negative GI ROS, Neg liver ROS,   Endo/Other  negative endocrine ROS  Renal/GU negative Renal ROS     Musculoskeletal negative musculoskeletal ROS (+)   Abdominal   Peds  Hematology negative hematology ROS (+)   Anesthesia Other Findings Day of surgery medications reviewed with the patient.  Reproductive/Obstetrics (+) Pregnancy Missed abortion                              Anesthesia Physical Anesthesia Plan  ASA: II  Anesthesia Plan: General   Post-op Pain Management:    Induction: Intravenous  PONV Risk Score and Plan: 3 and Dexamethasone and Ondansetron  Airway Management Planned: Oral ETT  Additional Equipment:   Intra-op Plan:   Post-operative Plan: Extubation in OR  Informed Consent: I have reviewed the patients History and Physical, chart, labs and discussed the procedure including the risks, benefits and alternatives for the proposed anesthesia with the patient or authorized representative who has indicated his/her understanding and acceptance.     Dental advisory given  Plan Discussed with: CRNA, Anesthesiologist and Surgeon  Anesthesia Plan Comments:        Anesthesia Quick Evaluation

## 2019-02-11 NOTE — Transfer of Care (Signed)
Immediate Anesthesia Transfer of Care Note  Patient: Theresa Carlson  Procedure(s) Performed: DILATATION AND EVACUATION (N/A )  Patient Location: PACU  Anesthesia Type:General  Level of Consciousness: drowsy  Airway & Oxygen Therapy: Patient Spontanous Breathing and Patient connected to face mask oxygen  Post-op Assessment: Report given to RN and Post -op Vital signs reviewed and stable  Post vital signs: Reviewed and stable  Last Vitals:  Vitals Value Taken Time  BP    Temp    Pulse 94 02/11/19 2200  Resp 17 02/11/19 2200  SpO2 100 % 02/11/19 2200  Vitals shown include unvalidated device data.  Last Pain:  Vitals:   02/11/19 1919  TempSrc: Oral  PainSc: 0-No pain         Complications: No apparent anesthesia complications

## 2019-02-11 NOTE — H&P (Signed)
Theresa Carlson is an 28 y.o. female G1 at 12+6 wk with missed AB had some VB today.  Came to office for eval.  Found to have missed AB measuring 10+wk.  D/W pt r/b/a of d&C.    Pertinent Gynecological History: OB History: G1, P0010 G1 12+ SAB  No abn pap No STD   Menstrual History:  No LMP recorded. [facility] 08/20/19)    Past Medical History:  Diagnosis Date  . Asthma    as a child  . Missed abortion 02/11/2019    Past Surgical History:  Procedure Laterality Date  . trigger thumb      Family History  Problem Relation Age of Onset  . Hypertension Mother   . Eczema Mother   . Diabetes Mother   . Hypertension Father   . Stroke Maternal Grandfather   . Prostate cancer Maternal Grandfather   . Breast cancer Paternal Grandmother   . Lung cancer Paternal Grandfather     Social History:  reports that she has never smoked. She has never used smokeless tobacco. She reports current alcohol use of about 1.0 standard drinks of alcohol per week. She reports that she does not use drugs.married, RN [employer]  Allergies: No Known Allergies  Meds: PNV, Probiotic    Review of Systems  Constitutional: Negative.   HENT: Negative.   Eyes: Negative.   Respiratory: Negative.   Cardiovascular: Negative.   Gastrointestinal: Negative.   Genitourinary: Negative.   Musculoskeletal: Negative.   Skin: Negative.   Neurological: Negative.   Psychiatric/Behavioral: Negative.     There were no vitals taken for this visit. Physical Exam  Constitutional: She is oriented to person, place, and time. She appears well-developed and well-nourished.  HENT:  Head: Normocephalic and atraumatic.  Cardiovascular: Normal rate and regular rhythm.  Respiratory: Effort normal and breath sounds normal. No respiratory distress. She has no wheezes.  GI: Soft. Bowel sounds are normal. She exhibits no distension. There is no abdominal tenderness.  Musculoskeletal: Normal range of motion.  Neurological: She is alert  and oriented to person, place, and time.  Skin: Skin is warm and dry.  Psychiatric: She has a normal mood and affect. Her behavior is normal.    Blood type A+   Assessment/Plan: 28yo G1P0010 at 12 wk w MAB measuring 10+ For suction D&C - d/w pt r/b/a Doxycycline pre and postop Covid test   Burech Mcfarland Bovard-Stuckert 02/11/2019, 5:07 PM

## 2019-02-11 NOTE — Anesthesia Postprocedure Evaluation (Signed)
Anesthesia Post Note  Patient: Kynisha E Shewmake  Procedure(s) Performed: DILATATION AND EVACUATION (N/A )     Patient location during evaluation: PACU Anesthesia Type: General Level of consciousness: awake and alert Pain management: pain level controlled Vital Signs Assessment: post-procedure vital signs reviewed and stable Respiratory status: spontaneous breathing, nonlabored ventilation, respiratory function stable and patient connected to nasal cannula oxygen Cardiovascular status: blood pressure returned to baseline and stable Postop Assessment: no apparent nausea or vomiting Anesthetic complications: no    Last Vitals:  Vitals:   02/11/19 2230 02/11/19 2238  BP: 123/87 116/78  Pulse: 78 80  Resp: 18 16  Temp:  (!) 36.4 C  SpO2: 100% 100%    Last Pain:  Vitals:   02/11/19 2238  TempSrc:   PainSc: 0-No pain                 Catalina Gravel

## 2019-02-11 NOTE — Brief Op Note (Signed)
02/11/2019  10:12 PM  PATIENT:  Theresa Carlson  28 y.o. female  PRE-OPERATIVE DIAGNOSIS:  Missed Abortion  POST-OPERATIVE DIAGNOSIS:  Missed Abortion  PROCEDURE:  Procedure(s): DILATATION AND EVACUATION (N/A)  SURGEON:  Surgeon(s) and Role:    * Bovard-Stuckert, Krishon Adkison, MD - Primary  ANESTHESIA:   local and general  EBL:  100 mL uop and IVF per anesthesia  BLOOD ADMINISTERED:none  DRAINS: none   LOCAL MEDICATIONS USED:  LIDOCAINE   SPECIMEN:  Source of Specimen:  POC  DISPOSITION OF SPECIMEN:  PATHOLOGY  COUNTS:  YES  TOURNIQUET:  * No tourniquets in log *  DICTATION: .Other Dictation: Dictation Number [id number]  PLAN OF CARE: Discharge to home after PACU  PATIENT DISPOSITION:  PACU - hemodynamically stable.   Delay start of Pharmacological VTE agent (>24hrs) due to surgical blood loss or risk of bleeding: not applicable

## 2019-02-12 ENCOUNTER — Encounter (HOSPITAL_COMMUNITY): Payer: Self-pay | Admitting: Obstetrics and Gynecology

## 2019-02-12 NOTE — Op Note (Signed)
NAME: Theresa Carlson, Theresa Carlson MEDICAL RECORD [id number] ACCOUNT [id number] DATE OF BIRTH:05/21/1991 FACILITY: [facility] LOCATION: [facility] PHYSICIAN:Louann Hopson BOVARD-STUCKERT, MD  OPERATIVE REPORT  DATE OF PROCEDURE:  02/11/2019  PREOPERATIVE DIAGNOSIS:  Missed abortion.  POSTOPERATIVE DIAGNOSIS:  Missed abortion.  PROCEDURE:  Suction dilatation and evacuation.  SURGEON:  Janyth Contes, MD  ANESTHESIA:  Local and general.  ESTIMATED BLOOD LOSS:  Approximately 100 mL.  URINE OUTPUT AND IV FLUIDS:  Per anesthesia.  COMPLICATIONS:  None.  PATHOLOGY:  Products of conception.  DESCRIPTION OF PROCEDURE:  After informed consent was reviewed with the patient including risks, benefits and alternatives of the surgical procedure, she was transported to the operating room and placed on the table in supine position.  General  anesthesia was induced and found to be adequate.  She was then placed in [place], prepped and draped in normal sterile fashion.  Her bladder was sterilely drained.  Using a heavy weighted speculum and a Sims retractor, her cervix was easily  visualized.  She was given a paracervical block with approximately 15 mL of 2% lidocaine.  Her cervix was then dilated to accommodate a 10-French curette, and products of conception were removed with several passes.  A larger curette was used to make  sure that there was no remaining tissue.  A small portion of the tissue was collected to send for Anora.  The rest of it was sent to pathology.  Her bleeding was reasonable.  Instruments were removed from her vagina.  She was returned to the supine  position.  She was awakened in stable condition.  Sponge, lap, and needle counts were correct x2 per the operating staff.  LN/NUANCE  D:02/11/2019 T:02/12/2019 JOB:[id number]

## 2019-03-01 ENCOUNTER — Ambulatory Visit (HOSPITAL_COMMUNITY)
Payer: No Typology Code available for payment source | Attending: Obstetrics and Gynecology | Admitting: Genetic Counselor

## 2019-03-01 ENCOUNTER — Other Ambulatory Visit: Payer: Self-pay

## 2019-03-01 ENCOUNTER — Ambulatory Visit (HOSPITAL_COMMUNITY): Payer: Self-pay | Admitting: Genetic Counselor

## 2019-03-01 DIAGNOSIS — Q999 Chromosomal abnormality, unspecified: Secondary | ICD-10-CM

## 2019-03-01 DIAGNOSIS — Z315 Encounter for genetic counseling: Secondary | ICD-10-CM | POA: Diagnosis not present

## 2019-03-01 DIAGNOSIS — Z3A12 12 weeks gestation of pregnancy: Secondary | ICD-10-CM

## 2019-03-01 NOTE — Progress Notes (Signed)
03/01/2019  Theresa Carlson [birth year] MRN: [id number] DOV: 03/01/2019  Theresa Carlson presented to the [facility] for Maternal Fetal [facility] for a genetics consultation regarding her recent pregnancy affected by triploidy. Theresa Carlson was accompanied to her appointment by her husband, Jakelyn Squyres.   Indication for genetic counseling - Fetal triploidy identified on Anora testing  Prenatal history  Theresa Carlson is a G1P0010, 28 y.o. female. She had a pregnancy that had completed [redacted]w[redacted]d before a missed spontaneous abortion was noted on 8/13. Per records, she underwent a dilation and evacuation (D&E) procedure at that time.  Theresa Carlson denied exposure to environmental toxins or chemical agents. She denied the use of alcohol, tobacco or street drugs during the pregnancy. She reported taking a probiotic and prenatal vitamins. She denied significant viral illnesses, fevers, and bleeding during the course of her pregnancy. Her medical and surgical histories were noncontributory.  Family History  A three generation pedigree was drafted and reviewed. The family history is remarkable for the following:  - Theresa Carlson's mother also had a miscarriage in the late first trimester of pregnancy. Theresa Carlson reported that no genetic testing was performed for that loss to her knowledge. We reviewed that approximately 1 in 4 pregnancies end in miscarriage, most often occurring due to chromosomal defects in the embryo. While the chance to have a pregnancy affected by a chromosomal condition increases with age for some conditions, other chromosomal conditions, such as triploidy, happen sporadically regardless of maternal age. See Discussion section for more details.  - Theresa Carlson has a maternal half-brother who has a daughter with a muscle condition, reportedly muscular dystrophy. There are several forms of muscular dystrophies that are genetic in nature that have various inheritance patterns. Recurrence risk is likely to be low for  Theresa Carlson's children given that the affected female would be a fourth-degree relative to Theresa Carlson's children. However, without more information about the condition, precise risk assessment is limited.  The remaining family histories were reviewed and found to be noncontributory for birth defects, intellectual disability, recurrent pregnancy loss, and known genetic conditions.    The patient's ethnicity is [place]. The father of the pregnancy's ethnicity is [place]. Ashkenazi Jewish ancestry and consanguinity were denied. Pedigree will be scanned under Media.  Discussion  Theresa Carlson was referred for genetic counseling as she had a miscarriage last week with abnormal Anora testing results. Fetal results were consistent with triploidy of maternal origin (digyny). Triploidy is caused by the presence of an entire extra set of chromosomes. Instead of having 16 chromosomes, fetuses with triploidy have 46. Results from [facility] testing indicated that the fetus had a chromosomal complement of 69,XXY, which is the most common form of triploidy. Digynic triploidy results from the fertilization of a diploid egg (an egg with an extra set of chromosomes) by one sperm.   Fetuses with digynic triploidy may have an enlarged head, severe growth restriction, and a small placenta on ultrasound. Cardiac defects, syndactyly (fusion of the digits), central nervous system anomalies, and abnormal gestational sacs are common findings among fetuses with both digynic triploidy and triploidy of paternal origin (diandry). Digynic triploidy does not put a woman at risk for gestational trophoblastic disease, which is a concern with diandric triploidy.   Triploidy is a fatal chromosome condition that most frequently results in spontaneous abortion, or miscarriage. It occurs in 1-3% of all recognized conceptions and accounts for approximately 20% of chromosomally abnormal first trimester miscarriages. The couple was counseled that  triploidy generally occurs sporadically and that recurrence is unlikely. Triploidy is not associated with increased maternal age unlike other chromosomal aneuploidies, such as Down syndrome.   We discussed options for triploidy screening/testing in future pregnancies. Firstly, fetuses with triploidy generally show signs of the condition on ultrasound, including multiple major malformations, an increased nuchal translucency or thickened nuchal fold, abnormal fluid levels, and fetal growth restriction. Secondly, SNP-based noninvasive prenatal screening (NIPS) platforms such as Panorama are able to detect triploid pregnancies with greater than 99% sensitivity. We reviewed that this test is not diagnostic for chromosome conditions, but can provide information regarding the presence or absence of extra fetal DNA. Finally, we discussed the option of diagnostic testing via chorionic villus sampling (CVS) or amniocentesis . We discussed the technical aspects of each procedure and quoted up to a 1 in 500 (0.2%) risk for spontaneous pregnancy loss or other adverse pregnancy outcomes as a result of either procedure. Cultured cells from either a placental or amniotic fluid sample allow for the visualization of a fetal karyotype, which can detect >99% of chromosomal aberrations, including triploidy.   The couple had several thoughtful questions that were answered during the session. Their feelings of loss were validated and they were reassured that there was nothing they did or did not do to cause this. The couple was provided with my business card should they think of any additional questions, or if they want to discuss testing options in future pregnancies.  I counseled Theresa Carlson regarding the above risks and available options. The approximate face-to-face time with the genetic counselor was 25 minutes.  In summary:  Discussed Anora testing result  Results indicated fetal triploidy of maternal (digynic)  origin  Reviewed options for future pregnancies  Screen for triploidy via ultrasounds or SNP-based NIPS (Panorama)  Diagnostic testing via CVS/amniocentesis  Reviewed family history concerns   Gershon CraneHaley E Loan Oguin, MS Genetic Counselor

## 2019-03-29 ENCOUNTER — Encounter (HOSPITAL_COMMUNITY): Payer: Self-pay | Admitting: Obstetrics and Gynecology

## 2019-05-18 LAB — OB RESULTS CONSOLE ABO/RH: RH Type: POSITIVE

## 2019-05-18 LAB — OB RESULTS CONSOLE GC/CHLAMYDIA
Chlamydia: NEGATIVE
Gonorrhea: NEGATIVE

## 2019-05-18 LAB — OB RESULTS CONSOLE HIV ANTIBODY (ROUTINE TESTING): HIV: NONREACTIVE

## 2019-05-18 LAB — OB RESULTS CONSOLE RUBELLA ANTIBODY, IGM: Rubella: IMMUNE

## 2019-05-18 LAB — OB RESULTS CONSOLE ANTIBODY SCREEN: Antibody Screen: NEGATIVE

## 2019-05-18 LAB — OB RESULTS CONSOLE RPR: RPR: NONREACTIVE

## 2019-05-18 LAB — OB RESULTS CONSOLE HEPATITIS B SURFACE ANTIGEN: Hepatitis B Surface Ag: NEGATIVE

## 2019-07-02 NOTE — L&D Delivery Note (Signed)
Delivery Note Pt pushed well for .  At 9:21 PM a viable and healthy female was delivered via  (Presentation: OA, ROT ).  APGAR: 7,9 ; weight  P.   Placenta status: manually estracted ,  .  Cord: 3V   with the following complications: nuchal.  Anesthesia: Epidural Episiotomy:  none Lacerations:  1st degree Suture Repair: 3.0 vicryl rapide Est. Blood Loss (mL):  73cc  Mom to postpartum.  Baby to Couplet care / Skin to Skin.  Ashleen Demma Bovard-Stuckert 12/17/2019, 9:47 PM  A+/RI/Tdap in PNC/Br/ Contra ?

## 2019-09-01 ENCOUNTER — Inpatient Hospital Stay (HOSPITAL_COMMUNITY)
Admission: AD | Admit: 2019-09-01 | Discharge: 2019-09-01 | Disposition: A | Discharge status: Home / Self Care | Payer: No Typology Code available for payment source | Attending: Obstetrics and Gynecology | Admitting: Obstetrics and Gynecology

## 2019-09-01 ENCOUNTER — Other Ambulatory Visit: Payer: Self-pay

## 2019-09-01 ENCOUNTER — Encounter (HOSPITAL_COMMUNITY): Payer: Self-pay | Admitting: Obstetrics and Gynecology

## 2019-09-01 DIAGNOSIS — R000 Tachycardia, unspecified: Secondary | ICD-10-CM | POA: Diagnosis not present

## 2019-09-01 DIAGNOSIS — O26892 Other specified pregnancy related conditions, second trimester: Secondary | ICD-10-CM | POA: Diagnosis not present

## 2019-09-01 DIAGNOSIS — Z3A24 24 weeks gestation of pregnancy: Secondary | ICD-10-CM | POA: Insufficient documentation

## 2019-09-01 LAB — URINALYSIS, ROUTINE W REFLEX MICROSCOPIC
Bilirubin Urine: NEGATIVE
Glucose, UA: NEGATIVE mg/dL
Hgb urine dipstick: NEGATIVE
Ketones, ur: NEGATIVE mg/dL
Leukocytes,Ua: NEGATIVE
Nitrite: NEGATIVE
Protein, ur: NEGATIVE mg/dL
Specific Gravity, Urine: 1.006 (ref 1.005–1.030)
pH: 7 (ref 5.0–8.0)

## 2019-09-01 LAB — CBC
HCT: 35 % — ABNORMAL LOW (ref 36.0–46.0)
Hemoglobin: 11.9 g/dL — ABNORMAL LOW (ref 12.0–15.0)
MCH: 31.1 pg (ref 26.0–34.0)
MCHC: 34 g/dL (ref 30.0–36.0)
MCV: 91.4 fL (ref 80.0–100.0)
Platelets: 207 10*3/uL (ref 150–400)
RBC: 3.83 MIL/uL — ABNORMAL LOW (ref 3.87–5.11)
RDW: 12.1 % (ref 11.5–15.5)
WBC: 9.4 10*3/uL (ref 4.0–10.5)
nRBC: 0 % (ref 0.0–0.2)

## 2019-09-01 NOTE — MAU Provider Note (Signed)
History     CSN: [id number]  Arrival date and time: 09/01/19 3235   First Provider Initiated Contact with Patient 09/01/19 [phone]      Chief Complaint  Patient presents with  . Hypertension  . Tachycardia   HPI  Ms.  Theresa Carlson is a 29 y.o. year old G56P0010 female at [redacted]w[redacted]d weeks gestation who presents to MAU reporting while at work she was notified by her Apple watch that her heart rate was too high. Her BP was taken and it was 136/80. She states she felt fine at first, but then began to feel hot & clammy. 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 She reports that she walks a far distance up hill from her car to get to the hospital, but does not think it affected her heart rate this morning. When the symptoms hit her she was sitting down receiving report from the off-going shift RN. She was not exerting any increased energy.   Past Medical History:  Diagnosis Date  . Asthma    as a child  . Missed abortion 02/11/2019  . S/P dilatation and curettage 02/11/2019    Past Surgical History:  Procedure Laterality Date  . DILATION AND EVACUATION N/A 02/11/2019   Procedure: DILATATION AND EVACUATION;  Surgeon: Sherian Rein, MD;  Location: [facility] OR;  Service: Gynecology;  Laterality: N/A;  . trigger thumb      Family History  Problem Relation Age of Onset  . Hypertension Mother   . Eczema Mother   . Diabetes Mother   . Hypertension Father   . Stroke Maternal Grandfather   . Prostate cancer Maternal Grandfather   . Breast cancer Paternal Grandmother   . Lung cancer Paternal Grandfather     Social History   Tobacco Use  . Smoking status: Never Smoker  . Smokeless tobacco: Never Used  Substance Use Topics  . Alcohol use: Not Currently    Alcohol/week: 1.0 standard drinks    Types: 1 Glasses of wine per week    Comment: occasonally   . Drug use: No    Allergies: No Known Allergies  Medications Prior to Admission  Medication Sig Dispense Refill Last  Dose  . Digestive Aids Mixture (DIGESTION GB PO) Take by mouth.   09/01/2019 at Unknown time  . Folic Acid (FOLATE PO) folic acid   09/01/2019 at Unknown time  . Multiple Vitamins-Minerals (WOMENS MULTI PO) Take by mouth.   09/01/2019 at Unknown time  . ibuprofen (ADVIL) 200 MG tablet Take 200 mg by mouth every 6 (six) hours as needed.     . ondansetron (ZOFRAN) 8 MG tablet Take 1 tablet (8 mg total) by mouth every 8 (eight) hours as needed for nausea or vomiting. 20 tablet 0   . UNABLE TO FIND Med Name: essential oils (metabolic blend)       Review of Systems  Constitutional: Negative.   HENT: Negative.   Eyes: Negative.   Respiratory: Negative.   Cardiovascular:       High HR = 150 bpm per Apple Watch  Gastrointestinal: Negative.   Endocrine: Negative.   Genitourinary: Negative.   Musculoskeletal: Negative.   Skin: Negative.   Allergic/Immunologic: Negative.   Neurological: Negative.   Hematological: Negative.   Psychiatric/Behavioral: Negative.    Physical Exam   Blood pressure 112/68, pulse 99, temperature 97.6 F (36.4 C), temperature source Oral, resp. rate 18, height 5\' 4"  (1.626 m), weight 67.1 kg, last menstrual period  03/16/2019, SpO2 99 %.  Physical Exam  Nursing note and vitals reviewed. Constitutional: She is oriented to person, place, and time. She appears well-developed and well-nourished.  HENT:  Head: Normocephalic and atraumatic.  Eyes: Pupils are equal, round, and reactive to light.  Cardiovascular: Normal rate and regular rhythm.  Normal EKG  Respiratory: Effort normal.  GI: Soft.  Genitourinary:    Genitourinary Comments: Pelvic not indicated   Musculoskeletal:        General: Normal range of motion.     Cervical back: Normal range of motion.  Neurological: She is alert and oriented to person, place, and time.  Skin: Skin is warm and dry.  Psychiatric: She has a normal mood and affect. Her behavior is normal. Judgment and thought content normal.   NST  - FHR: 130 bpm / moderate variability / 10 x 10 accels present / decels absent / TOCO: none   MAU Course  Procedures  MDM CCUA EKG CBC  Results for orders placed or performed during the hospital encounter of 09/01/19 (from the past 24 hour(s))  Urinalysis, Routine w reflex microscopic     Status: None   Collection Time: 09/01/19  8:33 AM  Result Value Ref Range   Color, Urine YELLOW YELLOW   APPearance CLEAR CLEAR   Specific Gravity, Urine 1.006 1.005 - 1.030   pH 7.0 5.0 - 8.0   Glucose, UA NEGATIVE NEGATIVE mg/dL   Hgb urine dipstick NEGATIVE NEGATIVE   Bilirubin Urine NEGATIVE NEGATIVE   Ketones, ur NEGATIVE NEGATIVE mg/dL   Protein, ur NEGATIVE NEGATIVE mg/dL   Nitrite NEGATIVE NEGATIVE   Leukocytes,Ua NEGATIVE NEGATIVE  CBC     Status: Abnormal   Collection Time: 09/01/19  9:36 AM  Result Value Ref Range   WBC 9.4 4.0 - 10.5 K/uL   RBC 3.83 (L) 3.87 - 5.11 MIL/uL   Hemoglobin 11.9 (L) 12.0 - 15.0 g/dL   HCT 35.0 (L) 36.0 - 46.0 %   MCV 91.4 80.0 - 100.0 fL   MCH 31.1 26.0 - 34.0 pg   MCHC 34.0 30.0 - 36.0 g/dL   RDW 12.1 11.5 - 15.5 %   Platelets 207 150 - 400 K/uL   nRBC 0.0 0.0 - 0.2 %    Assessment and Plan  Tachycardia - Plan: Discharge patient - Discussed that EKG, serial BPs and HgB were normal - Advised to monitor HR and keep a log of activity and HR that coordinates with that activity - Advised to stay well-hydrated, increase rest for today - Offered note to [facility] today -- declined - Discussed her symptoms could be normal variation for this stage in pregnancy. Discussed decreased lung capacity, increased blood volume, enlarging abdomen all contributing to tachycardia - Advised to call [facility], if symptoms persist or worsen - Keep scheduled appt on 09/21/2019 - Patient verbalized an understanding of the plan of care and agrees.   Laury Deep, MSN, CNM 09/01/2019, 8:42 AM

## 2019-09-01 NOTE — MAU Note (Signed)
Pt presents to MAU with c/o elevated heart rate and increased blood pressure. She was at work and checked her pulse (150 bpm) and blood pressure was 136/42. She also states that she felt clammy. She denies VB and LOF. +FM

## 2019-09-01 NOTE — Discharge Instructions (Signed)
Your EKG was normal.

## 2019-10-27 ENCOUNTER — Other Ambulatory Visit: Payer: Self-pay

## 2019-10-27 ENCOUNTER — Encounter: Payer: Self-pay | Admitting: Internal Medicine

## 2019-10-27 ENCOUNTER — Ambulatory Visit (INDEPENDENT_AMBULATORY_CARE_PROVIDER_SITE_OTHER): Payer: No Typology Code available for payment source | Admitting: Internal Medicine

## 2019-10-27 DIAGNOSIS — E785 Hyperlipidemia, unspecified: Secondary | ICD-10-CM

## 2019-10-27 DIAGNOSIS — Z0000 Encounter for general adult medical examination without abnormal findings: Secondary | ICD-10-CM

## 2019-10-27 LAB — URINALYSIS
Bilirubin Urine: NEGATIVE
Hgb urine dipstick: NEGATIVE
Ketones, ur: NEGATIVE
Leukocytes,Ua: NEGATIVE
Nitrite: NEGATIVE
Specific Gravity, Urine: <=1.005 — AB (ref 1.000–1.030)
Total Protein, Urine: NEGATIVE
Urine Glucose: NEGATIVE
Urobilinogen, UA: 0.2 (ref 0.0–1.0)
pH: 7 (ref 5.0–8.0)

## 2019-10-27 LAB — BASIC METABOLIC PANEL
BUN: 10 mg/dL (ref 6–23)
CO2: 26 mEq/L (ref 19–32)
Calcium: 8.5 mg/dL (ref 8.4–10.5)
Chloride: 104 mEq/L (ref 96–112)
Creatinine, Ser: 0.5 mg/dL (ref 0.40–1.20)
GFR: 146.84 mL/min (ref >60.00)
Glucose, Bld: 84 mg/dL (ref 70–99)
Potassium: 3.7 mEq/L (ref 3.5–5.1)
Sodium: 136 mEq/L (ref 135–145)

## 2019-10-27 LAB — TSH: TSH: 1.45 u[IU]/mL (ref 0.35–4.50)

## 2019-10-27 LAB — LIPID PANEL
Cholesterol: 273 mg/dL — ABNORMAL HIGH (ref 0–200)
HDL: 93 mg/dL (ref >39.00)
LDL Cholesterol: 155 mg/dL — ABNORMAL HIGH (ref 0–99)
NonHDL: 180.04
Total CHOL/HDL Ratio: 3
Triglycerides: 127 mg/dL (ref 0.0–149.0)
VLDL: 25.4 mg/dL (ref 0.0–40.0)

## 2019-10-27 LAB — HEPATIC FUNCTION PANEL
ALT: 16 U/L (ref 0–35)
AST: 19 U/L (ref 0–37)
Albumin: 3.3 g/dL — ABNORMAL LOW (ref 3.5–5.2)
Alkaline Phosphatase: 59 U/L (ref 39–117)
Bilirubin, Direct: 0.1 mg/dL (ref 0.0–0.3)
Total Bilirubin: 0.4 mg/dL (ref 0.2–1.2)
Total Protein: 6.2 g/dL (ref 6.0–8.3)

## 2019-10-27 LAB — VITAMIN B12: Vitamin B-12: 234 pg/mL (ref 211–911)

## 2019-10-27 LAB — VITAMIN D 25 HYDROXY (VIT D DEFICIENCY, FRACTURES): VITD: 44.04 ng/mL (ref 30.00–100.00)

## 2019-10-27 LAB — T4, FREE: Free T4: 0.61 ng/dL (ref 0.60–1.60)

## 2019-10-27 NOTE — Assessment & Plan Note (Addendum)
  We discussed age appropriate health related issues, including available/recomended screening tests and vaccinations. Labs were ordered to be later reviewed . All questions were answered. We discussed one or more of the following - seat belt use, use of sunscreen/sun exposure exercise, safe sex, fall risk reduction, second hand smoke exposure, firearm use and storage, seat belt use, a need for adhering to healthy diet and exercise. Labs were ordered. All questions were answered. F/u w/GYN. She is not vaccinated for COVID19. In my opinion Theresa Carlson should not be working wearing protective gear - it is too hard on her.

## 2019-10-27 NOTE — Progress Notes (Signed)
Subjective:  Patient ID: Theresa Carlson, female    DOB: 1991-02-13  Age: 29 y.o. MRN: [id number]  CC: No chief complaint on file.   HPI Bo E Caras presents for a well exam  Outpatient Medications Prior to Visit  Medication Sig Dispense Refill  . Digestive Aids Mixture (DIGESTION GB PO) Take by mouth.    . Folic Acid (FOLATE PO) folic acid    . Multiple Vitamins-Minerals (WOMENS MULTI PO) Take by mouth.    . Prenatal Vit-DSS-Fe Cbn-FA (PRENATAL AD PO) Prenatal    . UNABLE TO FIND Med Name: essential oils (metabolic blend)    . ondansetron (ZOFRAN) 8 MG tablet Take 1 tablet (8 mg total) by mouth every 8 (eight) hours as needed for nausea or vomiting. 20 tablet 0   No facility-administered medications prior to visit.    ROS: Review of Systems  Constitutional: Negative for activity change, appetite change, chills, fatigue and unexpected weight change.  HENT: Negative for congestion, mouth sores and sinus pressure.   Eyes: Negative for visual disturbance.  Respiratory: Negative for cough and chest tightness.   Gastrointestinal: Negative for abdominal pain and nausea.  Genitourinary: Negative for difficulty urinating, frequency and vaginal pain.  Musculoskeletal: Negative for back pain and gait problem.  Skin: Negative for pallor and rash.  Neurological: Negative for dizziness, tremors, weakness, numbness and headaches.  Psychiatric/Behavioral: Negative for confusion and sleep disturbance.    Objective:  BP 116/74 (BP Location: Left Arm, Patient Position: Sitting, Cuff Size: Normal)   Pulse 79   Temp 98.5 F (36.9 C) (Oral)   Ht 5\' 4"  (1.626 m)   Wt 153 lb (69.4 kg)   LMP 03/16/2019   SpO2 98%   BMI 26.26 kg/m   BP Readings from Last 3 Encounters:  10/27/19 116/74  09/01/19 113/65  02/11/19 116/78    Wt Readings from Last 3 Encounters:  10/27/19 153 lb (69.4 kg)  09/01/19 148 lb (67.1 kg)  02/11/19 125 lb (56.7 kg)    Physical Exam Constitutional:    General: She is not in acute distress.    Appearance: She is well-developed.  HENT:     Head: Normocephalic.     Right Ear: External ear normal.     Left Ear: External ear normal.     Nose: Nose normal.  Eyes:     General:        Right eye: No discharge.        Left eye: No discharge.     Conjunctiva/sclera: Conjunctivae normal.     Pupils: Pupils are equal, round, and reactive to light.  Neck:     Thyroid: No thyromegaly.     Vascular: No JVD.     Trachea: No tracheal deviation.  Cardiovascular:     Rate and Rhythm: Normal rate and regular rhythm.     Heart sounds: Normal heart sounds.  Pulmonary:     Effort: No respiratory distress.     Breath sounds: No stridor. No wheezing.  Abdominal:     General: Bowel sounds are normal. There is no distension.     Palpations: Abdomen is soft. There is no mass.     Tenderness: There is no abdominal tenderness. There is no guarding or rebound.  Musculoskeletal:        General: Tenderness present.     Cervical back: Normal range of motion and neck supple.  Lymphadenopathy:     Cervical: No cervical adenopathy.  Skin:    Findings: No  erythema or rash.  Neurological:     Cranial Nerves: No cranial nerve deficit.     Motor: No abnormal muscle tone.     Coordination: Coordination normal.     Deep Tendon Reflexes: Reflexes normal.  Psychiatric:        Behavior: Behavior normal.        Thought Content: Thought content normal.        Judgment: Judgment normal.   pregnant abdomen  Lab Results  Component Value Date   WBC 9.4 09/01/2019   HGB 11.9 (L) 09/01/2019   HCT 35.0 (L) 09/01/2019   PLT 207 09/01/2019   GLUCOSE 79 08/18/2018   CHOL 165 08/18/2018   TRIG 47.0 08/18/2018   HDL 70.60 08/18/2018   LDLCALC 85 08/18/2018   ALT 15 05/26/2018   AST 22 05/26/2018   NA 137 08/18/2018   K 4.3 08/18/2018   CL 104 08/18/2018   CREATININE 0.64 08/18/2018   BUN 15 08/18/2018   CO2 25 08/18/2018   TSH 1.77 05/26/2018   HGBA1C 4.8  08/18/2018    No results found.  Assessment & Plan:    Walker Kehr, MD

## 2019-10-27 NOTE — Assessment & Plan Note (Signed)
Pt wants a lipid check

## 2019-10-28 LAB — CBC WITH DIFFERENTIAL/PLATELET
Basophils Absolute: 0.1 10*3/uL (ref 0.0–0.1)
Basophils Relative: 0.6 % (ref 0.0–3.0)
Eosinophils Absolute: 0.1 10*3/uL (ref 0.0–0.7)
Eosinophils Relative: 0.9 % (ref 0.0–5.0)
HCT: 33.1 % — ABNORMAL LOW (ref 36.0–46.0)
Hemoglobin: 11.1 g/dL — ABNORMAL LOW (ref 12.0–15.0)
Lymphocytes Relative: 18.6 % (ref 12.0–46.0)
Lymphs Abs: 1.5 10*3/uL (ref 0.7–4.0)
MCHC: 33.6 g/dL (ref 30.0–36.0)
MCV: 89.5 fl (ref 78.0–100.0)
Monocytes Absolute: 0.6 10*3/uL (ref 0.1–1.0)
Monocytes Relative: 8.2 % (ref 3.0–12.0)
Neutro Abs: 5.7 10*3/uL (ref 1.4–7.7)
Neutrophils Relative %: 71.7 % (ref 43.0–77.0)
Platelets: 217 10*3/uL (ref 150.0–400.0)
RBC: 3.7 Mil/uL — ABNORMAL LOW (ref 3.87–5.11)
RDW: 12.9 % (ref 11.5–15.5)
WBC: 7.9 10*3/uL (ref 4.0–10.5)

## 2019-11-22 LAB — OB RESULTS CONSOLE GBS: GBS: NEGATIVE

## 2019-12-01 ENCOUNTER — Inpatient Hospital Stay (HOSPITAL_COMMUNITY)
Admission: AD | Admit: 2019-12-01 | Discharge: 2019-12-01 | Disposition: A | Discharge status: Home / Self Care | Payer: No Typology Code available for payment source | Attending: Obstetrics and Gynecology | Admitting: Obstetrics and Gynecology

## 2019-12-01 ENCOUNTER — Encounter (HOSPITAL_COMMUNITY): Payer: Self-pay | Admitting: Obstetrics and Gynecology

## 2019-12-01 DIAGNOSIS — O99513 Diseases of the respiratory system complicating pregnancy, third trimester: Secondary | ICD-10-CM | POA: Diagnosis not present

## 2019-12-01 DIAGNOSIS — Z3A37 37 weeks gestation of pregnancy: Secondary | ICD-10-CM | POA: Insufficient documentation

## 2019-12-01 DIAGNOSIS — J45909 Unspecified asthma, uncomplicated: Secondary | ICD-10-CM | POA: Insufficient documentation

## 2019-12-01 DIAGNOSIS — O133 Gestational [pregnancy-induced] hypertension without significant proteinuria, third trimester: Secondary | ICD-10-CM | POA: Diagnosis not present

## 2019-12-01 LAB — URINALYSIS, ROUTINE W REFLEX MICROSCOPIC
Bilirubin Urine: NEGATIVE
Glucose, UA: NEGATIVE mg/dL
Hgb urine dipstick: NEGATIVE
Ketones, ur: NEGATIVE mg/dL
Leukocytes,Ua: NEGATIVE
Nitrite: NEGATIVE
Protein, ur: NEGATIVE mg/dL
Specific Gravity, Urine: 1.003 — ABNORMAL LOW (ref 1.005–1.030)
pH: 6 (ref 5.0–8.0)

## 2019-12-01 NOTE — MAU Note (Signed)
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  Took BP at work at and got 160s-130s.  No headache, blurry vision, or RUQ. Denies contractions, LOF or VB. +FM.

## 2019-12-01 NOTE — MAU Provider Note (Signed)
Chief Complaint:  Hypertension   First Provider Initiated Contact with Patient 12/01/19 1910      HPI: Theresa Carlson is a 29 y.o. 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  Her pulse was elevated in the 120s to 130s on her Apple watch during the episodes.  Her BP was elevated on 2 different cuffs taken on her unit at 130s to 140s/80s.  She reports feeling better now.  There are no other s/sx.  She has not tried any treatments.  She had similar episodes and an MAU evaluation on 09/01/19. She reports good fetal movement.    HPI  Past Medical History: Past Medical History:  Diagnosis Date   Asthma    as a child   Missed abortion 02/11/2019   S/P dilatation and curettage 02/11/2019    Past obstetric history: OB History  Gravida Para Term Preterm AB Living  2       1    SAB TAB Ectopic Multiple Live Births  1            # Outcome Date GA Lbr Len/2nd Weight Sex Delivery Anes PTL Lv  2 Current           1 SAB             Past Surgical History: Past Surgical History:  Procedure Laterality Date   DILATION AND EVACUATION N/A 02/11/2019   Procedure: DILATATION AND EVACUATION;  Surgeon: Sherian Rein, MD;  Location: [facility] OR;  Service: Gynecology;  Laterality: N/A;   trigger thumb      Family History: Family History  Problem Relation Age of Onset   Hypertension Mother    Eczema Mother    Diabetes Mother    Hypertension Father    Stroke Maternal Grandfather    Prostate cancer Maternal Grandfather    Breast cancer Paternal Grandmother    Lung cancer Paternal Grandfather     Social History: Social History   Tobacco Use   Smoking status: Never Smoker   Smokeless tobacco: Never Used  Substance Use Topics   Alcohol use: Not Currently    Alcohol/week: 1.0 standard drinks    Types: 1 Glasses of wine per week    Comment: occasonally    Drug use: No    Allergies:  No Known Allergies  Meds:  Medications Prior to Admission  Medication Sig Dispense Refill Last Dose   Digestive Aids Mixture (DIGESTION GB PO) Take by mouth.   12/01/2019 at Unknown time   Folic Acid (FOLATE PO) folic acid   12/01/2019 at Unknown time   Prenatal Vit-DSS-Fe Cbn-FA (PRENATAL AD PO) Prenatal   12/01/2019 at Unknown time   Multiple Vitamins-Minerals (WOMENS MULTI PO) Take by mouth.      UNABLE TO FIND Med Name: essential oils (metabolic blend)       ROS:  Review of Systems  Constitutional: Positive for diaphoresis. Negative for chills, fatigue and fever.  Eyes: Negative for visual disturbance.  Respiratory: Negative for shortness of breath.   Cardiovascular: Negative for chest pain.  Gastrointestinal: Positive for nausea. Negative for abdominal pain and vomiting.  Genitourinary: Negative for difficulty urinating, dysuria, flank pain, pelvic pain, vaginal bleeding, vaginal discharge and vaginal pain.  Neurological: Positive for light-headedness. Negative for dizziness and headaches.  Psychiatric/Behavioral: Negative.      I have reviewed patient's Past Medical Hx, Surgical Hx, Family Hx, Social Hx, medications  and allergies.   Physical Exam   Patient Vitals for the past 24 hrs:  BP Temp Temp src Pulse Resp SpO2 Height Weight  12/01/19 1945 107/64 -- -- -- -- -- -- --  12/01/19 1944 107/64 -- -- 89 -- -- -- --  12/01/19 1940 -- -- -- -- -- 98 % -- --  12/01/19 1935 -- -- -- -- -- 98 % -- --  12/01/19 1930 -- -- -- -- -- 98 % -- --  12/01/19 1925 -- -- -- -- -- 99 % -- --  12/01/19 1905 -- -- -- -- -- 100 % -- --  12/01/19 1900 -- -- -- -- -- 100 % -- --  12/01/19 1857 (!) 90/54 -- -- (!) 105 -- -- -- --  12/01/19 1855 -- -- -- -- -- 99 % -- --  12/01/19 1839 -- -- -- -- -- 99 % -- --  12/01/19 1836 98/60 -- -- (!) 103 -- -- -- --  12/01/19 1835 -- -- -- -- -- 99 % -- --  12/01/19 1830 -- -- -- -- -- 98 % -- --  12/01/19 1801 116/78 98.4 F (36.9 C) Oral 92 16  100 % 5\' 5"  (1.651 m) 73.7 kg   Constitutional: Well-developed, well-nourished female in no acute distress.  Cardiovascular: normal rate Respiratory: normal effort GI: Abd soft, non-tender, gravid appropriate for gestational age.  MS: Extremities nontender, no edema, normal ROM Neurologic: Alert and oriented x 4.  GU: Neg CVAT.     FHT:  Baseline 125 , moderate variability, accelerations present, no decelerations Contractions: None on toco or to to palpation    Labs: Results for orders placed or performed during the hospital encounter of 12/01/19 (from the past 24 hour(s))  Urinalysis, Routine w reflex microscopic     Status: Abnormal   Collection Time: 12/01/19  6:38 PM  Result Value Ref Range   Color, Urine COLORLESS (A) YELLOW   APPearance CLEAR CLEAR   Specific Gravity, Urine 1.003 (L) 1.005 - 1.030   pH 6.0 5.0 - 8.0   Glucose, UA NEGATIVE NEGATIVE mg/dL   Hgb urine dipstick NEGATIVE NEGATIVE   Bilirubin Urine NEGATIVE NEGATIVE   Ketones, ur NEGATIVE NEGATIVE mg/dL   Protein, ur NEGATIVE NEGATIVE mg/dL   Nitrite NEGATIVE NEGATIVE   Leukocytes,Ua NEGATIVE NEGATIVE   --/--/A POS, A POS Performed at [facility], [address], [place], [place]  [phone] 1910)  Imaging:  No results found.  MAU Course/MDM: Orders Placed This Encounter  Procedures   Urinalysis, Routine w reflex microscopic   Discharge patient    No orders of the defined types were placed in this encounter.    NST reviewed and reactive Pt grossly normotensive in MAU without any s/sx of PEC Pt to follow up in office this week for BP check Pt discharge with strict return precautions.    Assessment: 1. Transient hypertension of pregnancy in third trimester     Plan: Discharge home Labor precautions and fetal kick counts Follow-up Information    [facility] Follow up.   Why: For blood pressure check on 12/02/19 or 12/03/19.  Return to MAU with other elevated  BP, with worsening symptoms or signs of labor. Contact information: [address]  [address] [place] [phone]          Allergies as of 12/01/2019   No Known Allergies     Medication List    TAKE these medications  DIGESTION GB PO Take by mouth.   FOLATE PO folic acid   PRENATAL AD PO Prenatal   UNABLE TO FIND Med Name: essential oils (metabolic blend)   WOMENS MULTI PO Take by mouth.       Fatima Blank Certified Nurse-Midwife 12/01/2019 8:03 PM

## 2019-12-09 ENCOUNTER — Telehealth (HOSPITAL_COMMUNITY): Payer: Self-pay | Admitting: *Deleted

## 2019-12-09 ENCOUNTER — Encounter (HOSPITAL_COMMUNITY): Payer: Self-pay | Admitting: *Deleted

## 2019-12-09 NOTE — Telephone Encounter (Signed)
Preadmission screen  

## 2019-12-15 ENCOUNTER — Other Ambulatory Visit (HOSPITAL_COMMUNITY): Payer: No Typology Code available for payment source

## 2019-12-16 ENCOUNTER — Other Ambulatory Visit: Payer: Self-pay | Admitting: Obstetrics and Gynecology

## 2019-12-16 ENCOUNTER — Other Ambulatory Visit (HOSPITAL_COMMUNITY)
Admission: RE | Admit: 2019-12-16 | Discharge: 2019-12-16 | Disposition: A | Discharge status: Home / Self Care | Payer: No Typology Code available for payment source | Source: Ambulatory Visit | Attending: Obstetrics and Gynecology | Admitting: Obstetrics and Gynecology

## 2019-12-16 DIAGNOSIS — Z01812 Encounter for preprocedural laboratory examination: Secondary | ICD-10-CM | POA: Insufficient documentation

## 2019-12-16 DIAGNOSIS — Z20822 Contact with and (suspected) exposure to covid-19: Secondary | ICD-10-CM | POA: Insufficient documentation

## 2019-12-16 LAB — SARS CORONAVIRUS 2 (TAT 6-24 HRS): SARS Coronavirus 2: NEGATIVE

## 2019-12-16 NOTE — H&P (Deleted)
  The note originally documented on this encounter has been moved the the encounter in which it belongs.  

## 2019-12-16 NOTE — H&P (Signed)
Theresa Carlson is a 29 y.o. female G2P0010 at 39+ [facility] 12/21/19) for IOL, fetus at 15% growth, nl dopplers and BPP.  Received Tdap in [facility].  D/W pt r/b/a and process of IOL.  OB History    Gravida  2   Para      Term      Preterm      AB  1   Living        SAB  1   TAB      Ectopic      Multiple      Live Births            G1 SAB G2 present  No abn pap, last 2018 No STD  Past Medical History:  Diagnosis Date  . Asthma    as a child  . Missed abortion 02/11/2019  . S/P dilatation and curettage 02/11/2019   Past Surgical History:  Procedure Laterality Date  . DILATION AND EVACUATION N/A 02/11/2019   Procedure: DILATATION AND EVACUATION;  Surgeon: Janyth Contes, MD;  Location: [facility];  Service: Gynecology;  Laterality: N/A;  . trigger thumb     Family History: family history includes Breast cancer in her paternal grandmother; Diabetes in her maternal grandfather; Eczema in her mother; Hypertension in her father and mother; Lung cancer in her paternal grandfather; Prostate cancer in her maternal grandfather; Stroke in her maternal grandfather. Social History:  reports that she has never smoked. She has never used smokeless tobacco. She reports previous alcohol use of about 1.0 standard drink of alcohol per week. She reports that she does not use drugs. 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   HENT: Negative.   Eyes: Negative.   Respiratory: Negative.   Cardiovascular: Negative.   Gastrointestinal: Negative.   Genitourinary: Negative.   Musculoskeletal: Positive for back  pain.  Skin: Negative.   Neurological: Negative.   Psychiatric/Behavioral: Negative.    Maternal Medical History:  Contractions: Frequency: irregular.   Perceived severity is moderate.    Fetal activity: Perceived fetal activity is normal.    Prenatal complications: no prenatal complications Prenatal Complications - Diabetes: none.      Last menstrual period 03/16/2019. Maternal Exam:  Uterine Assessment: Contraction strength is mild.  Contraction frequency is irregular.   Abdomen: Patient reports no abdominal tenderness. Fundal height is appropriate for gestation.   Estimated fetal weight is 7-7.5#.   Fetal presentation: vertex  Introitus: Normal vulva. Normal vagina.    Physical Exam  Constitutional: She is oriented to person, place, and time.  HENT:  Head: Normocephalic and atraumatic.  Cardiovascular: Normal rate and regular rhythm.  Respiratory: Effort normal and breath sounds normal.  GI: Soft. Normal appearance.  GRAVID  Genitourinary:    Vulva normal.   Musculoskeletal:        General: Normal range of motion.  Neurological: She is alert and oriented to person, place, and time.  Skin: Skin is warm and dry.  Psychiatric: Her behavior is normal. Mood normal.    Prenatal labs: ABO, Rh: A/Positive/-- (11/17 0000) Antibody: Negative (11/17 0000) Rubella: Immune (11/17 0000) RPR: Nonreactive (11/17 0000)  HBsAg:  Negative (11/17 0000)  HIV: Non-reactive (11/17 0000)  GBS: Negative/-- (05/24 0000)   Tdap 3/23 Hgb 13.5/Plt219/Ur Cx neg/GC neg/Chl neg/Varicella immune/glucola 138 - nl 3hr GTT/panorama low risk female/nlserial dopplers and BPP [place] nl limited anat, ant plac, female F/u nl anat 15% growth after s<d Nl dopplers and BPP  Assessment/Plan: 29yo G2P0010 at 39+ for IOL given SGA  IOL with cytotec, pitocin, AROM gbbs neg Epidural prn   [name] 12/16/2019, 8:15 PM

## 2019-12-17 ENCOUNTER — Inpatient Hospital Stay (HOSPITAL_COMMUNITY): Payer: No Typology Code available for payment source | Admitting: Anesthesiology

## 2019-12-17 ENCOUNTER — Inpatient Hospital Stay (HOSPITAL_COMMUNITY)
Admission: RE | Admit: 2019-12-17 | Discharge: 2019-12-19 | DRG: 807 | Disposition: A | Discharge status: Home / Self Care | Payer: No Typology Code available for payment source | Attending: Obstetrics and Gynecology | Admitting: Obstetrics and Gynecology

## 2019-12-17 ENCOUNTER — Other Ambulatory Visit: Payer: Self-pay

## 2019-12-17 ENCOUNTER — Encounter (HOSPITAL_COMMUNITY): Payer: Self-pay | Admitting: Obstetrics and Gynecology

## 2019-12-17 ENCOUNTER — Inpatient Hospital Stay (HOSPITAL_COMMUNITY): Payer: No Typology Code available for payment source

## 2019-12-17 DIAGNOSIS — O365930 Maternal care for other known or suspected poor fetal growth, third trimester, not applicable or unspecified: Principal | ICD-10-CM | POA: Diagnosis present

## 2019-12-17 DIAGNOSIS — Z3A39 39 weeks gestation of pregnancy: Secondary | ICD-10-CM | POA: Diagnosis not present

## 2019-12-17 DIAGNOSIS — Z20822 Contact with and (suspected) exposure to covid-19: Secondary | ICD-10-CM | POA: Diagnosis present

## 2019-12-17 DIAGNOSIS — Z3493 Encounter for supervision of normal pregnancy, unspecified, third trimester: Secondary | ICD-10-CM

## 2019-12-17 LAB — CBC
HCT: 33.6 % — ABNORMAL LOW (ref 36.0–46.0)
Hemoglobin: 10.7 g/dL — ABNORMAL LOW (ref 12.0–15.0)
MCH: 28 pg (ref 26.0–34.0)
MCHC: 31.8 g/dL (ref 30.0–36.0)
MCV: 88 fL (ref 80.0–100.0)
Platelets: 228 K/uL (ref 150–400)
RBC: 3.82 MIL/uL — ABNORMAL LOW (ref 3.87–5.11)
RDW: 13.1 % (ref 11.5–15.5)
WBC: 8.1 K/uL (ref 4.0–10.5)
nRBC: 0 % (ref 0.0–0.2)

## 2019-12-17 LAB — TYPE AND SCREEN
ABO/RH(D): A POS
Antibody Screen: NEGATIVE

## 2019-12-17 LAB — SYPHILIS: RPR W/REFLEX TO RPR TITER AND TREPONEMAL ANTIBODIES, TRADITIONAL SCREENING AND DIAGNOSIS ALGORITHM: RPR Ser Ql: NONREACTIVE

## 2019-12-17 MED ORDER — MISOPROSTOL 25 MCG QUARTER TABLET: 25.0000 ug | ORAL_TABLET | Freq: Three times a day (TID) | ORAL | Status: DC | PRN

## 2019-12-17 MED ORDER — SOD CITRATE-CITRIC ACID 500-334 MG/5ML PO SOLN: 30.0000 mL | ORAL | Status: DC | PRN

## 2019-12-17 MED ORDER — LIDOCAINE HCL (PF) 1 % IJ SOLN: 30.0000 mL | INTRAMUSCULAR | Status: DC | PRN

## 2019-12-17 MED ORDER — TERBUTALINE SULFATE 1 MG/ML IJ SOLN: 0.2500 mg | Freq: Once | INTRAMUSCULAR | Status: DC | PRN

## 2019-12-17 MED ORDER — ACETAMINOPHEN 325 MG PO TABS: 650.0000 mg | ORAL_TABLET | ORAL | Status: DC | PRN

## 2019-12-17 MED ORDER — LACTATED RINGERS IV SOLN: INTRAVENOUS | Status: DC

## 2019-12-17 MED ORDER — SODIUM CHLORIDE (PF) 0.9 % IJ SOLN
INTRAMUSCULAR | Status: DC | PRN
  Administered 2019-12-17: 12 mL/h via EPIDURAL

## 2019-12-17 MED ORDER — LACTATED RINGERS IV SOLN
500.0000 mL | Freq: Once | INTRAVENOUS | Status: AC
  Administered 2019-12-17: 500 mL via INTRAVENOUS

## 2019-12-17 MED ORDER — MISOPROSTOL 50MCG HALF TABLET
50.0000 ug | ORAL_TABLET | Freq: Three times a day (TID) | ORAL | Status: DC | PRN
  Administered 2019-12-17: 50 ug via ORAL
  Filled 2019-12-17 (×2): qty 1

## 2019-12-17 MED ORDER — OXYTOCIN BOLUS FROM INFUSION
333.0000 mL | Freq: Once | INTRAVENOUS | Status: AC
  Administered 2019-12-17: 333 mL via INTRAVENOUS

## 2019-12-17 MED ORDER — EPHEDRINE 5 MG/ML INJ: 10.0000 mg | INTRAVENOUS | Status: DC | PRN

## 2019-12-17 MED ORDER — ONDANSETRON HCL 4 MG/2ML IJ SOLN
4.0000 mg | Freq: Four times a day (QID) | INTRAMUSCULAR | Status: DC | PRN
  Administered 2019-12-17: 4 mg via INTRAVENOUS
  Filled 2019-12-17: qty 2

## 2019-12-17 MED ORDER — OXYCODONE-ACETAMINOPHEN 5-325 MG PO TABS: 2.0000 | ORAL_TABLET | ORAL | Status: DC | PRN

## 2019-12-17 MED ORDER — SODIUM CHLORIDE 0.9 % IV SOLN
2.0000 g | Freq: Two times a day (BID) | INTRAVENOUS | Status: AC
  Administered 2019-12-17 – 2019-12-18 (×2): 2 g via INTRAVENOUS
  Filled 2019-12-17 (×3): qty 2

## 2019-12-17 MED ORDER — FENTANYL-BUPIVACAINE-NACL 0.5-0.125-0.9 MG/250ML-% EP SOLN
12.0000 mL/h | EPIDURAL | Status: DC | PRN
  Filled 2019-12-17: qty 250

## 2019-12-17 MED ORDER — OXYTOCIN-SODIUM CHLORIDE 30-0.9 UT/500ML-% IV SOLN
1.0000 m[IU]/min | INTRAVENOUS | Status: DC
  Administered 2019-12-17: 2 m[IU]/min via INTRAVENOUS
  Filled 2019-12-17: qty 500

## 2019-12-17 MED ORDER — DIPHENHYDRAMINE HCL 50 MG/ML IJ SOLN
12.5000 mg | INTRAMUSCULAR | Status: DC | PRN
  Administered 2019-12-17 (×2): 12.5 mg via INTRAVENOUS
  Filled 2019-12-17: qty 1

## 2019-12-17 MED ORDER — LACTATED RINGERS IV SOLN: 500.0000 mL | INTRAVENOUS | Status: DC | PRN

## 2019-12-17 MED ORDER — BUTORPHANOL TARTRATE 1 MG/ML IJ SOLN
1.0000 mg | INTRAMUSCULAR | Status: DC | PRN
  Administered 2019-12-17: 1 mg via INTRAVENOUS
  Filled 2019-12-17: qty 1

## 2019-12-17 MED ORDER — OXYCODONE-ACETAMINOPHEN 5-325 MG PO TABS: 1.0000 | ORAL_TABLET | ORAL | Status: DC | PRN

## 2019-12-17 MED ORDER — PHENYLEPHRINE 40 MCG/ML (10ML) SYRINGE FOR IV PUSH (FOR BLOOD PRESSURE SUPPORT): 80.0000 ug | PREFILLED_SYRINGE | INTRAVENOUS | Status: DC | PRN

## 2019-12-17 MED ORDER — PHENYLEPHRINE 40 MCG/ML (10ML) SYRINGE FOR IV PUSH (FOR BLOOD PRESSURE SUPPORT)
80.0000 ug | PREFILLED_SYRINGE | INTRAVENOUS | Status: DC | PRN
  Filled 2019-12-17: qty 10

## 2019-12-17 MED ORDER — LIDOCAINE HCL (PF) 1 % IJ SOLN
INTRAMUSCULAR | Status: DC | PRN
  Administered 2019-12-17 (×2): 5 mL via EPIDURAL

## 2019-12-17 MED ORDER — OXYTOCIN-SODIUM CHLORIDE 30-0.9 UT/500ML-% IV SOLN
2.5000 [IU]/h | INTRAVENOUS | Status: DC
  Administered 2019-12-17: 2.5 [IU]/h via INTRAVENOUS

## 2019-12-17 MED ORDER — MISOPROSTOL 25 MCG QUARTER TABLET
25.0000 ug | ORAL_TABLET | Freq: Three times a day (TID) | ORAL | Status: DC | PRN
  Administered 2019-12-17: 25 ug via VAGINAL
  Filled 2019-12-17: qty 1

## 2019-12-17 NOTE — Progress Notes (Signed)
Patient ID: Theresa Carlson, female   DOB: 08-25-1990, 29 y.o.   MRN: [id number]  Reviewed H&P, no changes.  Reviewed POC  AFVSS gen NAD FHTs 120-130, mod var, + accels, category 1 toco q 2-4 min  RN gave oral cytotec  Continue close monitoring

## 2019-12-17 NOTE — Anesthesia Preprocedure Evaluation (Signed)
Anesthesia Evaluation  Patient identified by MRN, date of birth, ID band Patient awake    Reviewed: Allergy & Precautions, NPO status , Patient's Chart, lab work & pertinent test results  Airway Mallampati: II  TM Distance: >3 FB Neck ROM: Full    Dental no notable dental hx. (+) Dental Advisory Given   Pulmonary neg pulmonary ROS,    Pulmonary exam normal        Cardiovascular negative cardio ROS Normal cardiovascular exam     Neuro/Psych negative neurological ROS  negative psych ROS   GI/Hepatic negative GI ROS, Neg liver ROS,   Endo/Other  negative endocrine ROS  Renal/GU negative Renal ROS  negative genitourinary   Musculoskeletal negative musculoskeletal ROS (+)   Abdominal   Peds negative pediatric ROS (+)  Hematology negative hematology ROS (+)   Anesthesia Other Findings   Reproductive/Obstetrics (+) Pregnancy                             Anesthesia Physical Anesthesia Plan  ASA: II  Anesthesia Plan: Epidural   Post-op Pain Management:    Induction:   PONV Risk Score and Plan:   Airway Management Planned: Natural Airway  Additional Equipment:   Intra-op Plan:   Post-operative Plan:   Informed Consent: I have reviewed the patients History and Physical, chart, labs and discussed the procedure including the risks, benefits and alternatives for the proposed anesthesia with the patient or authorized representative who has indicated his/her understanding and acceptance.     Dental advisory given  Plan Discussed with: Anesthesiologist  Anesthesia Plan Comments:         Anesthesia Quick Evaluation  

## 2019-12-17 NOTE — Progress Notes (Signed)
Patient ID: Theresa Carlson, female   DOB: 03/17/1991, 29 y.o.   MRN: [id number]  Comfortable, just voiding a lot  AFVSS  gen NAD FHTs 120-130's, mod var, + accels, category 1 toco q 1-2 min  SVE 1.5/30/-2 Will wait for next cytotec dose, per vagina.  Expect ROM and pitocin in afternoon Plan for SVD

## 2019-12-17 NOTE — Progress Notes (Signed)
Patient ID: Theresa Carlson, female   DOB: 1991/05/05, 29 y.o.   MRN: [id number]  Ctx got so much more uncomfortable, getting comfortable w epidural  AFVSS gen NAD FHTs 110-120's, mod var, some earlies, category 1 toco q 1-2 min  SROM for clear fluid 2.3/70/-2  Continue current mgmt

## 2019-12-17 NOTE — Anesthesia Procedure Notes (Signed)
Epidural Patient location during procedure: OB Start time: 12/17/2019 4:06 PM End time: 12/17/2019 4:20 PM  Staffing Anesthesiologist: Heather Roberts, MD Performed: anesthesiologist   Preanesthetic Checklist Completed: patient identified, IV checked, site marked, risks and benefits discussed, monitors and equipment checked, pre-op evaluation and timeout performed  Epidural Patient position: sitting Prep: DuraPrep Patient monitoring: heart rate, cardiac monitor, continuous pulse ox and blood pressure Approach: midline Location: L2-L3 Injection technique: LOR saline  Needle:  Needle type: Tuohy  Needle gauge: 17 G Needle length: 9 cm Needle insertion depth: 6 cm Catheter size: 20 Guage Catheter at skin depth: 11 cm Test dose: negative and Other  Assessment Events: blood not aspirated, injection not painful, no injection resistance and negative IV test  Additional Notes Informed consent obtained prior to proceeding including risk of failure, 1% risk of PDPH, risk of minor discomfort and bruising.  Discussed rare but serious complications including epidural abscess, permanent nerve injury, epidural hematoma.  Discussed alternatives to epidural analgesia and patient desires to proceed.  Timeout performed pre-procedure verifying patient name, procedure, and platelet count.  Patient tolerated procedure well.

## 2019-12-18 LAB — CBC
HCT: 33.4 % — ABNORMAL LOW (ref 36.0–46.0)
Hemoglobin: 10.7 g/dL — ABNORMAL LOW (ref 12.0–15.0)
MCH: 27.6 pg (ref 26.0–34.0)
MCHC: 32 g/dL (ref 30.0–36.0)
MCV: 86.1 fL (ref 80.0–100.0)
Platelets: 198 10*3/uL (ref 150–400)
RBC: 3.88 MIL/uL (ref 3.87–5.11)
RDW: 13.2 % (ref 11.5–15.5)
WBC: 14.6 10*3/uL — ABNORMAL HIGH (ref 4.0–10.5)
nRBC: 0 % (ref 0.0–0.2)

## 2019-12-18 MED ORDER — ONDANSETRON HCL 4 MG/2ML IJ SOLN: 4.0000 mg | INTRAMUSCULAR | Status: DC | PRN

## 2019-12-18 MED ORDER — NALBUPHINE HCL 10 MG/ML IJ SOLN
5.0000 mg | Freq: Once | INTRAMUSCULAR | Status: AC | PRN
  Administered 2019-12-18: 5 mg via INTRAVENOUS

## 2019-12-18 MED ORDER — SIMETHICONE 80 MG PO CHEW: 80.0000 mg | CHEWABLE_TABLET | ORAL | Status: DC | PRN

## 2019-12-18 MED ORDER — NALBUPHINE HCL 10 MG/ML IJ SOLN
5.0000 mg | INTRAMUSCULAR | Status: DC | PRN
  Filled 2019-12-18: qty 1

## 2019-12-18 MED ORDER — PRENATAL MULTIVITAMIN CH
1.0000 | ORAL_TABLET | Freq: Every day | ORAL | Status: DC
  Administered 2019-12-18 – 2019-12-19 (×2): 1 via ORAL
  Filled 2019-12-18 (×2): qty 1

## 2019-12-18 MED ORDER — NALBUPHINE HCL 10 MG/ML IJ SOLN: 5.0000 mg | Freq: Once | INTRAMUSCULAR | Status: AC | PRN

## 2019-12-18 MED ORDER — COCONUT OIL OIL
1.0000 "application " | TOPICAL_OIL | Status: DC | PRN
  Administered 2019-12-18: 1 via TOPICAL

## 2019-12-18 MED ORDER — SENNOSIDES-DOCUSATE SODIUM 8.6-50 MG PO TABS
2.0000 | ORAL_TABLET | ORAL | Status: DC
  Administered 2019-12-18 – 2019-12-19 (×2): 2 via ORAL
  Filled 2019-12-18 (×2): qty 2

## 2019-12-18 MED ORDER — ONDANSETRON HCL 4 MG PO TABS: 4.0000 mg | ORAL_TABLET | ORAL | Status: DC | PRN

## 2019-12-18 MED ORDER — DIPHENHYDRAMINE HCL 50 MG/ML IJ SOLN: 12.5000 mg | INTRAMUSCULAR | Status: DC | PRN

## 2019-12-18 MED ORDER — OXYCODONE HCL 5 MG PO TABS: 10.0000 mg | ORAL_TABLET | ORAL | Status: DC | PRN

## 2019-12-18 MED ORDER — LACTATED RINGERS IV SOLN: INTRAVENOUS | Status: DC

## 2019-12-18 MED ORDER — ACETAMINOPHEN 325 MG PO TABS
650.0000 mg | ORAL_TABLET | ORAL | Status: DC | PRN
  Administered 2019-12-19: 650 mg via ORAL
  Filled 2019-12-18: qty 2

## 2019-12-18 MED ORDER — DIPHENHYDRAMINE HCL 25 MG PO CAPS: 25.0000 mg | ORAL_CAPSULE | ORAL | Status: DC | PRN

## 2019-12-18 MED ORDER — DIBUCAINE (PERIANAL) 1 % EX OINT: 1.0000 "application " | TOPICAL_OINTMENT | CUTANEOUS | Status: DC | PRN

## 2019-12-18 MED ORDER — IBUPROFEN 600 MG PO TABS
600.0000 mg | ORAL_TABLET | Freq: Four times a day (QID) | ORAL | Status: DC
  Administered 2019-12-18 – 2019-12-19 (×6): 600 mg via ORAL
  Filled 2019-12-18 (×6): qty 1

## 2019-12-18 MED ORDER — BENZOCAINE-MENTHOL 20-0.5 % EX AERO
1.0000 "application " | INHALATION_SPRAY | CUTANEOUS | Status: DC | PRN
  Filled 2019-12-18: qty 56

## 2019-12-18 MED ORDER — WITCH HAZEL-GLYCERIN EX PADS: 1.0000 "application " | MEDICATED_PAD | CUTANEOUS | Status: DC | PRN

## 2019-12-18 MED ORDER — NALOXONE HCL 4 MG/10ML IJ SOLN
1.0000 ug/kg/h | INTRAVENOUS | Status: DC | PRN
  Filled 2019-12-18: qty 5

## 2019-12-18 MED ORDER — ZOLPIDEM TARTRATE 5 MG PO TABS: 5.0000 mg | ORAL_TABLET | Freq: Every evening | ORAL | Status: DC | PRN

## 2019-12-18 MED ORDER — OXYCODONE HCL 5 MG PO TABS: 5.0000 mg | ORAL_TABLET | ORAL | Status: DC | PRN

## 2019-12-18 MED ORDER — NALBUPHINE HCL 10 MG/ML IJ SOLN: 5.0000 mg | INTRAMUSCULAR | Status: DC | PRN

## 2019-12-18 MED ORDER — DIPHENHYDRAMINE HCL 25 MG PO CAPS: 25.0000 mg | ORAL_CAPSULE | Freq: Four times a day (QID) | ORAL | Status: DC | PRN

## 2019-12-18 NOTE — Lactation Note (Signed)
This note was copied from a baby's chart. Lactation Consultation Note  Patient Name: Theresa Carlson Today's Date: 12/18/2019 Reason for consult: Initial assessment;1st time breastfeeding;Term P1, 7 hour term female infant with bruise on back of head. 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 Infant had 2 stools since birth. Tools given : hand pump to pre-pump breast  prior to latching infant. Per mom, infant latched twice with 3 attempts, infant been sleepy.  Mom desires to latch infant on her right breast. Mom will do breast stimulation or pre-pump breast prior to latching infant to help extend nipple shaft out more to help with latch due to having flat nipples.  LC ask mom to do breast stimulation prior to latching infant on right breast using the football hold, mom express a small amount of colostrum out prior to latching infant, bring infant chin first to breast. Infant latched and breastfed for 5 minutes before becoming sleepy. Infant took 4 mls of colostrum from spoon. Mom knows to breastfeed infant according to cues, on demand, 8 to 12+ times within 24 hours and not exceed 3 hours without BF infant. Mom knows to call RN or LC if she needs assistance with latching infant at breast.   Maternal Data Formula Feeding for Exclusion: No Has patient been taught Hand Expression?: Yes Does the patient have breastfeeding experience prior to this delivery?: No  Feeding Feeding Type: Breast Fed  LATCH Score Latch: Grasps breast easily, tongue down, lips flanged, rhythmical sucking.  Audible Swallowing: Spontaneous and intermittent  Type of Nipple: Everted at rest and after stimulation  Comfort (Breast/Nipple): Soft / non-tender  Hold (Positioning): Assistance needed to correctly position infant at breast and maintain latch.  LATCH Score: 9  Interventions Interventions: Breast feeding basics  reviewed;Breast compression;Assisted with latch;Adjust position;Skin to skin;Support pillows;Breast massage;Position options;Hand express;Expressed milk;Hand pump  Lactation Tools Discussed/Used WIC Program: No   Consult Status Consult Status: Follow-up Date: 12/18/19 Follow-up type: In-patient    Danelle Earthly 12/18/2019, 4:31 AM

## 2019-12-18 NOTE — Plan of Care (Signed)
  Problem: Education: Goal: Knowledge of condition will improve Outcome: Adequate for Discharge   

## 2019-12-18 NOTE — Anesthesia Postprocedure Evaluation (Signed)
Anesthesia Post Note  Patient: Theresa Carlson  Procedure(s) Performed: AN AD HOC LABOR EPIDURAL     Patient location during evaluation: Mother Baby Anesthesia Type: Epidural Level of consciousness: awake and alert and oriented Pain management: pain level controlled Vital Signs Assessment: post-procedure vital signs reviewed and stable Respiratory status: spontaneous breathing Cardiovascular status: blood pressure returned to baseline Postop Assessment: no headache, adequate PO intake, no backache, patient able to bend at knees, able to ambulate, epidural receding and no apparent nausea or vomiting Anesthetic complications: no   No complications documented.  Last Vitals:  Vitals:   12/18/19 0305 12/18/19 0650  BP: 106/68 103/67  Pulse: 96 67  Resp: 18 18  Temp: 36.7 C 37.1 C  SpO2:      Last Pain:  Vitals:   12/18/19 0650  TempSrc: Oral  PainSc:    Pain Goal:                   Salome Arnt

## 2019-12-18 NOTE — Lactation Note (Signed)
This note was copied from a baby's chart. Lactation Consultation Note  Patient Name: Theresa Carlson Today's Date: 12/18/2019 Reason for consult: Follow-up assessment  P1 mother whose infant is now 29 hours old.  This is a term baby at 39+3 weeks.  Baby was swaddled and asleep when I arrived.  Mother had a few questions regarding breast feeding.  Reviewed basic information with her.  Mother  Concerned that baby remains sleepy; reassured her that this is typical behavior for a baby at this age.    Mother is familiar with hand expression and has a manual pump at bedside.  She has spoon fed colostrum drops to baby.  Praised her for her efforts and talked about cluster feeding.  Mother will continue to feed 8-12 times/24 hours or sooner if baby shows cues.  She will call her RN/LC for latch assistance as needed.  Mother is a Producer, television/film/video.  Provided her DEBP and completed all paperwork.  Information filed in the folders in the cabinet.  Returned insurance card to mother.  Discussed pump set up, suction pressure, flange sizing and milk storage times with mother.  She will return to work after 12 weeks of leave.  Father and grandmother present.   Maternal Data    Feeding Feeding Type: Breast Fed  LATCH Score                   Interventions    Lactation Tools Discussed/Used     Consult Status Consult Status: Follow-up Date: 12/19/19 Follow-up type: In-patient    Dora Sims 12/18/2019, 6:41 PM

## 2019-12-18 NOTE — Progress Notes (Addendum)
Post Partum Day 1 Subjective: no complaints, up ad lib, voiding, tolerating PO and nl lochia, pain controlled.  Working on breast feeding  Objective: Blood pressure 103/67, pulse 67, temperature 98.7 F (37.1 C), temperature source Oral, resp. rate 18, height 5\' 4"  (1.626 m), weight 75.2 kg, last menstrual period 03/16/2019, SpO2 97 %, unknown if currently breastfeeding.  Physical Exam:  General: alert and no distress Lochia: appropriate Uterine Fundus: firm  Recent Labs    12/17/19 0744 12/18/19 0641  HGB 10.7* 10.7*  HCT 33.6* 33.4*    Assessment/Plan: Plan for discharge tomorrow, Breastfeeding and Lactation consult.     LOS: 1 day   Theresa Carlson 12/18/2019, 10:17 AM

## 2019-12-19 MED ORDER — IBUPROFEN 600 MG PO TABS: 600.0000 mg | ORAL_TABLET | Freq: Four times a day (QID) | ORAL | 1 refills | Status: DC | PRN

## 2019-12-19 NOTE — Plan of Care (Signed)
Problem: Education: Goal: Knowledge of General Education information will improve Description: Including pain rating scale, medication(s)/side effects and non-pharmacologic comfort measures Outcome: Completed/Met   Problem: Clinical Measurements: Goal: Ability to maintain clinical measurements within normal limits will improve Outcome: Completed/Met Goal: Will remain free from infection Outcome: Completed/Met Goal: Diagnostic test results will improve Outcome: Completed/Met Goal: Respiratory complications will improve Outcome: Completed/Met Goal: Cardiovascular complication will be avoided Outcome: Completed/Met   Problem: Activity: Goal: Risk for activity intolerance will decrease Outcome: Completed/Met   Problem: Pain Managment: Goal: General experience of comfort will improve Outcome: Completed/Met   Problem: Safety: Goal: Ability to remain free from injury will improve Outcome: Completed/Met   Problem: Skin Integrity: Goal: Risk for impaired skin integrity will decrease Outcome: Completed/Met

## 2019-12-19 NOTE — Plan of Care (Signed)
  Problem: Education: Goal: Knowledge of General Education information will improve Description: Including pain rating scale, medication(s)/side effects and non-pharmacologic comfort measures Outcome: Completed/Met   Problem: Clinical Measurements: Goal: Ability to maintain clinical measurements within normal limits will improve Outcome: Completed/Met Goal: Will remain free from infection Outcome: Completed/Met Goal: Diagnostic test results will improve Outcome: Completed/Met Goal: Respiratory complications will improve Outcome: Completed/Met Goal: Cardiovascular complication will be avoided Outcome: Completed/Met   Problem: Activity: Goal: Risk for activity intolerance will decrease Outcome: Completed/Met   Problem: Elimination: Goal: Will not experience complications related to urinary retention Outcome: Completed/Met   Problem: Pain Managment: Goal: General experience of comfort will improve Outcome: Completed/Met   Problem: Safety: Goal: Ability to remain free from injury will improve Outcome: Completed/Met   Problem: Skin Integrity: Goal: Risk for impaired skin integrity will decrease Outcome: Completed/Met   Problem: Education: Goal: Knowledge of condition will improve Outcome: Completed/Met   Problem: Activity: Goal: Will verbalize the importance of balancing activity with adequate rest periods Outcome: Completed/Met Goal: Ability to tolerate increased activity will improve Outcome: Completed/Met   Problem: Life Cycle: Goal: Chance of risk for complications during the postpartum period will decrease Outcome: Completed/Met   Problem: Role Relationship: Goal: Ability to demonstrate positive interaction with newborn will improve Outcome: Completed/Met

## 2019-12-19 NOTE — Progress Notes (Signed)
Post Partum Day 2 Subjective: no complaints, up ad lib, voiding, tolerating PO and nl lochia, pain controlled  Objective: Blood pressure (!) 98/55, pulse 76, temperature 98 F (36.7 C), temperature source Oral, resp. rate 18, height 5\' 4"  (1.626 m), weight 75.2 kg, last menstrual period 03/16/2019, SpO2 99 %, unknown if currently breastfeeding.  Physical Exam:  General: alert and no distress Lochia: appropriate Uterine Fundus: firm   Recent Labs    12/17/19 0744 12/18/19 0641  HGB 10.7* 10.7*  HCT 33.6* 33.4*    Assessment/Plan: Discharge home, Breastfeeding and Lactation consult.  Routine PP care.  D/C with Motrin and PNV.  F/u 6 weeks.   LOS: 2 days   Shiri Hodapp Bovard-Stuckert 12/19/2019, 8:22 AM

## 2019-12-19 NOTE — Discharge Summary (Signed)
Postpartum Discharge Summary     Patient Name: Theresa Carlson DOB: July 13, 1990 MRN: [id number]  Date of admission: 12/17/2019 Delivery date:12/17/2019  Delivering provider: Janyth Contes  Date of discharge: 12/19/2019  Admitting diagnosis: Normal pregnancy in third trimester [Z34.93] Intrauterine pregnancy: [redacted]w[redacted]d    Secondary diagnosis:  Principal Problem:   SVD (spontaneous vaginal delivery) Active Problems:   Normal pregnancy in third trimester  Additional problems: N/A    Discharge diagnosis: Term Pregnancy Delivered                                              Post partum procedures:N/A Augmentation: AROM, Pitocin and Cytotec Complications: None  Hospital course: Induction of Labor With Vaginal Delivery   29y.o. yo G2P1011 at 317w3das admitted to the hospital 12/17/2019 for induction of labor.  Indication for induction: Elective.  Patient had an uncomplicated labor course as follows: Membrane Rupture Time/Date: 2:57 PM ,12/17/2019   Delivery Method:Vaginal, Spontaneous  Episiotomy: None  Lacerations:  1st degree  Details of delivery can be found in separate delivery note.  Patient had a routine postpartum course. Patient is discharged home 12/19/19.  Newborn Data: Birth date:12/17/2019  Birth time:9:21 PM  Gender:Female  Living status:Living  Apgars:7 ,9  Weight:3060 g   Magnesium Sulfate received: No BMZ received: No Rhophylac:No MMR:No T-DaP:Given prenatally Flu: No Transfusion:No  Physical exam  Vitals:   12/18/19 0650 12/18/19 1501 12/18/19 2127 12/19/19 0536  BP: 103/67 111/71 (!) 94/54 (!) 98/55  Pulse: 67 72 64 76  Resp: _0 Temp: 98.7 F (37.1 C) 98.2 F (36.8 C) 98.5 F (36.9 C) 98 F (36.7 C)  TempSrc: Oral Oral Oral Oral  SpO2:  99%    Weight:      Height:       General: alert and no distress Lochia: appropriate Uterine Fundus: firm Incision: Healing well with no significant drainage DVT Evaluation: No evidence of DVT  seen on physical exam. Labs: Lab Results  Component Value Date   WBC 14.6 (H) 12/18/2019   HGB 10.7 (L) 12/18/2019   HCT 33.4 (L) 12/18/2019   MCV 86.1 12/18/2019   PLT 198 12/18/2019   CMP Latest Ref Rng & Units 10/27/2019  Glucose 70 - 99 mg/dL 84  BUN 6 - 23 mg/dL 10  Creatinine 0.40 - 1.20 mg/dL 0.50  Sodium 135 - 145 mEq/L 136  Potassium 3.5 - 5.1 mEq/L 3.7  Chloride 96 - 112 mEq/L 104  CO2 19 - 32 mEq/L 26  Calcium 8.4 - 10.5 mg/dL 8.5  Total Protein 6.0 - 8.3 g/dL 6.2  Total Bilirubin 0.2 - 1.2 mg/dL 0.4  Alkaline Phos 39 - 117 U/L 59  AST 0 - 37 U/L 19  ALT 0 - 35 U/L 16   Edinburgh Score: Edinburgh Postnatal Depression Scale Screening Tool 12/18/2019  I have been able to laugh and see the funny side of things. 0  I have looked forward with enjoyment to things. 0  I have blamed myself unnecessarily when things went wrong. 1  I have been anxious or worried for no good reason. 0  I have felt scared or panicky for no good reason. 0  Things have been getting on top of me. 0  I have been so unhappy that I have had difficulty sleeping. 0  I have  felt sad or miserable. 0  I have been so unhappy that I have been crying. 0  The thought of harming myself has occurred to me. 0  Edinburgh Postnatal Depression Scale Total 1      After visit meds:  Allergies as of 12/19/2019   No Known Allergies     Medication List    TAKE these medications   DIGESTION GB PO Take 1 tablet by mouth daily.   FOLATE PO Take 1 tablet by mouth daily.   ibuprofen 600 MG tablet Commonly known as: ADVIL Take 1 tablet (600 mg total) by mouth every 6 (six) hours as needed.   PRENATAL AD PO Take 1 tablet by mouth daily.        Discharge home in stable condition Infant Feeding: Breast Infant Disposition:home with mother Discharge instruction: per After Visit Summary and Postpartum booklet. Activity: Advance as tolerated. Pelvic rest for 6 weeks.  Diet: routine diet Anticipated  Birth Control: Unsure Postpartum Appointment:6 weeks Additional Postpartum F/U: N/A Future Appointments:No future appointments. Follow up Visit:  Follow-up Information    Bovard-Stuckert, Jermaine Neuharth, MD. Schedule an appointment as soon as possible for a visit in 6 week(s).   Specialty: Obstetrics and Gynecology Why: For postpartum check Contact information: [address] [place] [address] [phone]                   12/19/2019 Janyth Contes, MD

## 2019-12-19 NOTE — Lactation Note (Signed)
This note was copied from a baby's chart. Lactation Consultation Note  Patient Name: Girl Zanyah Eberly Today's Date: 12/19/2019 Reason for consult: Follow-up assessment;Difficult latch;Primapara;1st time breastfeeding;Term  LC in to visit with P1 Mom of term baby at 20 hrs old.  Baby at 7.2% weight loss.    Mom has been supplementing baby throughout the night with EBM/donor milk/formula due to painful latches.  Mom has been hand expressing 5-10 ml and adding 10-15 ml of donor milk and curved tip syringe feeding baby.  Offered latch assist/assess at next feeding.  Mom placed baby STS on her chest and called when baby started to cue.  Mom prefers football hold.  Mom using good support and reminded her to move her breast support to base of breast.  Mom cried out with initial latch when baby opened wide.  Initiated 20 mm nipple shield, and pre-loaded shield with donor milk, and Mom cried out and took baby right off after 10 seconds of sucking.    On digital assessment, baby's oral cavity tight, high arched palate, posterior lingual frenulum identified.  Tongue cupping finger, but rear of tongue lifting up and rubbing against finger.    Recommended having baby evaluated by Pediatrician for referral to oral surgeon/pediatric dentist.  Talked about follow-up with OP lactation consultant after discharge.  Mom stated she wasn't that sure about wanting to latch baby to breast as she has sensitive nipples.  Mom instructed to double pump for 15-20 mins every 2-3 hrs like baby would be breastfeeding. Mom will be taught paced bottle feeding to increase volume per guidelines provided, increasing as tolerated.  Engorgement prevention and treatment reviewed.  Mom aware of OP lactation support and encouraged to call prn.  Feeding Feeding Type: Breast Fed  LATCH Score Latch: Grasps breast easily, tongue down, lips flanged, rhythmical sucking.  Audible Swallowing: None  Type of Nipple: Everted at rest  and after stimulation  Comfort (Breast/Nipple): Filling, red/small blisters or bruises, mild/mod discomfort  Hold (Positioning): Full assist, staff holds infant at breast  LATCH Score: 5  Interventions Interventions: Breast feeding basics reviewed;Assisted with latch;Skin to skin;Breast massage;Hand express;Adjust position;Pre-pump if needed;Support pillows;Position options;Expressed milk;DEBP  Lactation Tools Discussed/Used Tools: Nipple Shields Nipple shield size: 20 Breast pump type: Double-Electric Breast Pump   Consult Status Consult Status: Complete Date: 12/19/19 Follow-up type: Call as needed    Judee Clara 12/19/2019, 11:57 AM

## 2020-05-22 ENCOUNTER — Other Ambulatory Visit: Payer: Self-pay

## 2020-05-22 ENCOUNTER — Telehealth (INDEPENDENT_AMBULATORY_CARE_PROVIDER_SITE_OTHER): Payer: No Typology Code available for payment source | Admitting: Family

## 2020-05-22 DIAGNOSIS — J029 Acute pharyngitis, unspecified: Secondary | ICD-10-CM | POA: Diagnosis not present

## 2020-05-22 MED ORDER — AMOXICILLIN-POT CLAVULANATE 875-125 MG PO TABS: 1.0000 | ORAL_TABLET | Freq: Two times a day (BID) | ORAL | 0 refills | Status: AC

## 2020-05-22 MED ORDER — MAGIC MOUTHWASH: 5.0000 mL | Freq: Four times a day (QID) | ORAL | 0 refills | Status: DC | PRN

## 2020-05-22 NOTE — Progress Notes (Signed)
Theresa Carlson is a 29 y.o. female with the following history as recorded in Epic[employer]:  Patient Active Problem List   Diagnosis Date Noted  . Normal pregnancy in third trimester 12/17/2019  . SVD (spontaneous vaginal delivery) 12/17/2019  . Tachycardia 09/01/2019  . Missed abortion 02/11/2019  . S/P dilatation and curettage 02/11/2019  . Dyslipidemia 08/13/2018  . Family history of diabetes mellitus 08/13/2018  . Abscess 04/02/2018  . 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 Avulsion fracture of tibial tuberosity 02/13/2018  . Knee pain, right 01/21/2018  . Well adult exam 05/07/2016  . Acute URI 06/23/2015    Current Outpatient Medications  Medication Sig Dispense Refill  . amoxicillin-clavulanate (AUGMENTIN) 875-125 MG tablet Take 1 tablet by mouth 2 (two) times daily for 10 days. 20 tablet 0  . Digestive Aids Mixture (DIGESTION GB PO) Take 1 tablet by mouth daily.     . Folic Acid (FOLATE PO) Take 1 tablet by mouth daily.     Marland Kitchen ibuprofen (ADVIL) 600 MG tablet Take 1 tablet (600 mg total) by mouth every 6 (six) hours as needed. 45 tablet 1  . Prenatal Vit-DSS-Fe Cbn-FA (PRENATAL AD PO) Take 1 tablet by mouth daily.      No current facility-administered medications for this visit.    Allergies: Patient has no known allergies.  Past Medical History:  Diagnosis Date  . Asthma    as a child  . Missed abortion 02/11/2019  . S/P dilatation and curettage 02/11/2019  . SVD (spontaneous vaginal delivery) 12/17/2019    Past Surgical History:  Procedure Laterality Date  . DILATION AND EVACUATION N/A 02/11/2019   Procedure: DILATATION AND EVACUATION;  Surgeon: Sherian Rein, MD;  Location: [facility] OR;  Service: Gynecology;  Laterality: N/A;  . trigger thumb      Family History  Problem Relation Age of Onset  . Hypertension Mother   . Eczema Mother   . Hypertension Father   . Stroke Maternal Grandfather   . Prostate cancer Maternal Grandfather   . Diabetes Maternal Grandfather   . Breast  cancer Paternal Grandmother   . Lung cancer Paternal Grandfather     Social History   Tobacco Use  . Smoking status: Never Smoker  . Smokeless tobacco: Never Used  Substance Use Topics  . Alcohol use: Not Currently    Alcohol/week: 1.0 standard drink    Types: 1 Glasses of wine per week    Comment: occasonally     Subjective:   I connected with [name] on 05/22/20 at  3:00 PM EST by a video enabled telemedicine application and verified that I am speaking with the correct person using two identifiers.   I discussed the limitations of evaluation and management by telemedicine and the availability of in person appointments. The patient expressed understanding and agreed to proceed. Provider in office/ patient is at home; provider and patient are only 2 people on video call.   Concerned for strep throat; sore throat and fever x 2 days; + white patches in back of throat; is prone to strep throat; has already had COVID test done today per employer requirement; fever as high as 102- responding to Motrin;   Patient is not breast-feeding;    Objective:  There were no vitals filed for this visit.  General: Well developed, well nourished, in no acute distress  Head: Normocephalic and atraumatic  Lungs: Respirations unlabored;  Neurologic: Alert and oriented; speech intact; face symmetrical;   Assessment:  1. Acute sore throat  Plan:  Concern for strep throat; Rx for Augmentin 875 mg bid x 10 days and Magic Mouthwash for symptom relief; increase fluids, rest and follow-up worse, no better.    No follow-ups on file.  No orders of the defined types were placed in this encounter.   Requested Prescriptions   Signed Prescriptions Disp Refills  . amoxicillin-clavulanate (AUGMENTIN) 875-125 MG tablet 20 tablet 0    Sig: Take 1 tablet by mouth 2 (two) times daily for 10 days.

## 2020-05-27 ENCOUNTER — Telehealth: Payer: No Typology Code available for payment source | Admitting: Nurse Practitioner

## 2020-05-27 DIAGNOSIS — B373 Candidiasis of vulva and vagina: Secondary | ICD-10-CM

## 2020-05-27 DIAGNOSIS — B3731 Acute candidiasis of vulva and vagina: Secondary | ICD-10-CM

## 2020-05-27 MED ORDER — FLUCONAZOLE 150 MG PO TABS: 150.0000 mg | ORAL_TABLET | Freq: Every day | ORAL | 0 refills | Status: AC

## 2020-05-27 NOTE — Progress Notes (Signed)
We are sorry that you are not feeling well. Here is how we plan to help! Based on what you shared with me it looks like you: May have a yeast vaginosis  Vaginosis is an inflammation of the vagina that can result in discharge, itching and pain. The cause is usually a change in the normal balance of vaginal bacteria or an infection. Vaginosis can also result from reduced estrogen levels after menopause.  The most common causes of vaginosis are:   Bacterial vaginosis which results from an overgrowth of one on several organisms that are normally present in your vagina.   Yeast infections which are caused by a naturally occurring fungus called candida.   Vaginal atrophy (atrophic vaginosis) which results from the thinning of the vagina from reduced estrogen levels after menopause.   Trichomoniasis which is caused by a parasite and is commonly transmitted by sexual intercourse.  Factors that increase your risk of developing vaginosis include: . Medications, such as antibiotics and steroids . Uncontrolled diabetes . Use of hygiene products such as bubble bath, vaginal spray or vaginal deodorant . Douching . Wearing damp or tight-fitting clothing . Using an intrauterine device (IUD) for birth control . Hormonal changes, such as those associated with pregnancy, birth control pills or menopause . Sexual activity . Having a sexually transmitted infection  Your treatment plan is A single Diflucan (fluconazole) 150mg tablet once.  I have electronically sent this prescription into the pharmacy that you have chosen.  Be sure to take all of the medication as directed. Stop taking any medication if you develop a rash, tongue swelling or shortness of breath. Mothers who are breast feeding should consider pumping and discarding their breast milk while on these antibiotics. However, there is no consensus that infant exposure at these doses would be harmful.  Remember that medication creams can weaken latex  condoms. .   HOME CARE:  Good hygiene may prevent some types of vaginosis from recurring and may relieve some symptoms:  . Avoid baths, hot tubs and whirlpool spas. Rinse soap from your outer genital area after a shower, and dry the area well to prevent irritation. Don't use scented or harsh soaps, such as those with deodorant or antibacterial action. . Avoid irritants. These include scented tampons and pads. . Wipe from front to back after using the toilet. Doing so avoids spreading fecal bacteria to your vagina.  Other things that may help prevent vaginosis include:  . Don't douche. Your vagina doesn't require cleansing other than normal bathing. Repetitive douching disrupts the normal organisms that reside in the vagina and can actually increase your risk of vaginal infection. Douching won't clear up a vaginal infection. . Use a latex condom. Both female and female latex condoms may help you avoid infections spread by sexual contact. . Wear cotton underwear. Also wear pantyhose with a cotton crotch. If you feel comfortable without it, skip wearing underwear to bed. Yeast thrives in moist environments Your symptoms should improve in the next day or two.  GET HELP RIGHT AWAY IF:  . You have pain in your lower abdomen ( pelvic area or over your ovaries) . You develop nausea or vomiting . You develop a fever . Your discharge changes or worsens . You have persistent pain with intercourse . You develop shortness of breath, a rapid pulse, or you faint.  These symptoms could be signs of problems or infections that need to be evaluated by a medical provider now.  MAKE SURE YOU      Understand these instructions.  Will watch your condition.  Will get help right away if you are not doing well or get worse.  Your e-visit answers were reviewed by a board certified advanced clinical practitioner to complete your personal care plan. Depending upon the condition, your plan could have included  both over the counter or prescription medications. Please review your pharmacy choice to make sure that you have choses a pharmacy that is open for you to pick up any needed prescription, Your safety is important to us. If you have drug allergies check your prescription carefully.   You can use MyChart to ask questions about today's visit, request a non-urgent call back, or ask for a work or school excuse for 24 hours related to this e-Visit. If it has been greater than 24 hours you will need to follow up with your provider, or enter a new e-Visit to address those concerns. You will get a MyChart message within the next two days asking about your experience. I hope that your e-visit has been valuable and will speed your recovery.  I have spent at least 5 minutes reviewing and documenting in the patient's chart. 

## 2020-07-22 ENCOUNTER — Encounter: Payer: Self-pay | Admitting: Physician Assistant

## 2020-07-22 ENCOUNTER — Telehealth: Payer: No Typology Code available for payment source | Admitting: Physician Assistant

## 2020-07-22 DIAGNOSIS — J040 Acute laryngitis: Secondary | ICD-10-CM

## 2020-07-22 DIAGNOSIS — J028 Acute pharyngitis due to other specified organisms: Secondary | ICD-10-CM

## 2020-07-22 DIAGNOSIS — Z20822 Contact with and (suspected) exposure to covid-19: Secondary | ICD-10-CM

## 2020-07-22 MED ORDER — BENZONATATE 100 MG PO CAPS
100.0000 mg | ORAL_CAPSULE | Freq: Three times a day (TID) | ORAL | 0 refills | Status: DC | PRN
Start: 2020-07-22 — End: 2020-08-17

## 2020-07-22 MED ORDER — FLUTICASONE PROPIONATE 50 MCG/ACT NA SUSP
2.0000 | Freq: Every day | NASAL | 6 refills | Status: DC
Start: 2020-07-22 — End: 2020-07-25

## 2020-07-22 MED ORDER — AMOXICILLIN 500 MG PO CAPS
500.0000 mg | ORAL_CAPSULE | Freq: Two times a day (BID) | ORAL | 0 refills | Status: DC
Start: 2020-07-22 — End: 2020-08-17

## 2020-07-22 NOTE — Progress Notes (Signed)
E-Visit for Corona Virus Screening  Your current symptoms could be consistent with the coronavirus.  Many health care providers can now test patients at their office but not all are.  [place] has multiple testing sites. For information on our COVID testing locations and hours go to [email or web address]  We are enrolling you in our MyChart Home Monitoring for COVID19 . Daily you will receive a questionnaire within the MyChart website. Our COVID 19 response team will be monitoring your responses daily.  Testing Information: The COVID-19 Community Testing sites are testing BY APPOINTMENT ONLY.  You can schedule online at [email or web address]  If you do not have access to a smart phone or computer you may call [phone] for an appointment.   Additional testing sites in the Community:  . For [facility] Testing sites in [facility]  [email or web address]  . For Pop-up testing sites in [place]  [email or web address]  . For [facility] [email or web address]  . For [facility] testing in [place] and [employer] [email or web address]  . For [facility] testing in [facility]   [email or web address]  For  more information about community testing call [phone]   Please quarantine yourself while awaiting your test results. Please stay home for a minimum of 10 days from the first day of illness with improving symptoms and you have had 24 hours of no fever (without the use of Tylenol (Acetaminophen) Motrin (Ibuprofen) or any fever reducing medication).  Also - Do not get tested prior to returning to work because once you have had a positive test the test can stay  positive for more than a month in some cases.   You should wear a mask or cloth face covering over your nose and mouth if you must be around other people or animals, including pets (even at home). Try to stay at least 6 feet away from other people. This will protect the people around you.  Please continue good preventive care measures, including:  frequent hand-washing, avoid touching your face, cover coughs/sneezes, stay out of crowds and keep a 6 foot distance from others.  COVID-19 is a respiratory illness with symptoms that are similar to the flu. Symptoms are typically mild to moderate, but there have been cases of severe illness and death due to the virus.   The following symptoms may appear 2-14 days after exposure: . Fever . Cough . Shortness of breath or difficulty breathing . Chills . Repeated shaking with chills . Muscle pain . Headache . Sore throat . New loss of taste or smell . Fatigue . Congestion or runny nose . Nausea or vomiting . Diarrhea  Go to the nearest hospital ED for assessment if fever/cough/breathlessness are severe or illness seems like a threat to life.  It is vitally important that if you feel that you have an infection such as this virus or any other virus that you stay home and away from places where you may spread it to others.  You should avoid contact with people age 2 and older.   You can use medication such as A prescription cough medication called Tessalon Perles 100 mg. You may take 1-2 capsules every 8 hours as needed for cough and A prescription for Fluticasone nasal spray 2 sprays in each nostril one time per day   Persistent severe sore throat and hoarseness may be viral in nature, however, due to the chronicity and severity of symptoms, will prescribe antibiotic, Amoxicillin 500 mg, take one pill twice daily for 7 days.  You may also take acetaminophen (Tylenol) as needed for fever.  Reduce your risk of any infection by using the same  precautions used for avoiding the common cold or flu:  Marland Kitchen Wash your hands often with soap and warm water for at least 20 seconds.  If soap and water are not readily available, use an alcohol-based hand sanitizer with at least 60% alcohol.  . If coughing or sneezing, cover your mouth and nose by coughing or sneezing into the elbow areas of your shirt or coat, into a tissue or into your sleeve (not your hands). . Avoid shaking hands with others and consider head nods or verbal greetings only. . Avoid touching your eyes, nose, or mouth with unwashed hands.  . Avoid close contact with people who are sick. . Avoid places or events with large numbers of people in one location, like concerts or sporting events. . Carefully consider travel plans you have or are making. . If you are planning any travel outside or inside the [place], visit the CDC's Travelers' Health webpage for the latest health notices. . If you have some symptoms but not all symptoms, continue to monitor at home and seek medical attention if your symptoms worsen. . If you are having a medical emergency, call 911.  HOME CARE . Only take medications as instructed by your medical team. . Drink plenty of fluids and get plenty of rest. . A steam or ultrasonic humidifier can help if you have congestion.   GET HELP RIGHT AWAY IF YOU HAVE EMERGENCY WARNING SIGNS** FOR COVID-19. If you or someone is showing any of these signs seek emergency medical care immediately. Call 911 or proceed to your closest emergency facility if: . You develop worsening high fever. . Trouble breathing . Bluish lips or face . Persistent pain or pressure in the chest . New confusion . Inability to wake or stay awake . You cough up blood. . Your symptoms become more severe  **This list is not all possible symptoms. Contact your medical provider for any symptoms that are sever or concerning to you.  MAKE SURE YOU   Understand these instructions.  Will watch your  condition.  Will get help right away if you are not doing well or get worse.  Your e-visit answers were reviewed by a board certified advanced clinical practitioner to complete your personal care plan.  Depending on the condition, your plan could have included both over the counter or prescription medications.  If there is a problem please reply once you have received a response from your provider.  Your safety is important to [place].  If you have drug allergies check your prescription carefully.    You can use MyChart to ask questions about today's visit, request a non-urgent call back, or ask for a work or school excuse for 24 hours related to this e-Visit. If it has been greater than 24 hours you will need to follow up with your provider, or enter a new e-Visit to address those concerns. You will get an e-mail in the next two days asking about your experience.  I hope that your e-visit has been valuable and will speed your recovery. Thank you for using e-visits.   I spent 5-10 minutes on review and completion of this note- Illa Level [facility]

## 2020-07-22 NOTE — Progress Notes (Signed)
Evisit may have been created in error. Previous Evisit addressed.   NOTE: If you entered your credit card information for this eVisit, you will not be charged. You may see a "hold" on your card for the $35 but that hold will drop off and you will not have a charge processed.   If you are having a true medical emergency please call 911.      For an urgent face to face visit, [facility] has five urgent care centers for your convenience:     [facility] at [facility] Directions [phone] [address] [place], [place] [address] . 10 am - 6pm Monday - Friday    [facility]) Get Driving Directions [phone] [address] [place], [place] [address] . 10 am to 8 pm Monday-Friday . 12 pm to 8 pm [facility] at [facility] Get Driving Directions [phone] [address] [place], [place] [address] . 8 am to 8 pm Monday-Friday . 9 am to 6 pm Saturday . 11 am to 6 pm Sunday     [facility] at [facility] Get Driving Directions  [phone] [address] [place], [place] [address] . 8 am to 8 pm Monday-Friday . 8 am to 4 pm [facility] at [facility] Directions [phone] [address] [place], [place] [address] . 12 pm to 6 pm Monday-Friday      Your e-visit answers were reviewed by a board certified advanced clinical practitioner to complete your personal care plan.  Thank you for using e-Visits.    I spent 5-10 minutes on review and completion of this note- Illa Level [facility]

## 2020-07-25 ENCOUNTER — Other Ambulatory Visit (HOSPITAL_COMMUNITY): Payer: Self-pay | Admitting: Family Medicine

## 2020-07-25 ENCOUNTER — Telehealth: Payer: Self-pay | Admitting: Family

## 2020-07-25 DIAGNOSIS — J069 Acute upper respiratory infection, unspecified: Secondary | ICD-10-CM

## 2020-07-25 MED ORDER — CETIRIZINE HCL 10 MG PO TABS: 10.0000 mg | ORAL_TABLET | Freq: Every day | ORAL | 11 refills | Status: DC

## 2020-07-25 MED ORDER — FLUTICASONE PROPIONATE 50 MCG/ACT NA SUSP: 2.0000 | Freq: Every day | NASAL | 6 refills | Status: DC

## 2020-07-25 MED FILL — NITROFURANTOIN MONO-MCR 100: 100 | 5 days supply | Qty: 10 | Fill #0

## 2020-07-25 NOTE — Progress Notes (Signed)
We are sorry you are not feeling well.  Here is how we plan to help!  Based on what you have shared with me, it looks like you may have a viral upper respiratory infection.  Upper respiratory infections are caused by a large number of viruses; however, rhinovirus is the most common cause.   Symptoms vary from person to person, with common symptoms including sore throat, cough, fatigue or lack of energy and feeling of general discomfort.  A low-grade fever of up to 100.4 may present, but is often uncommon.  Symptoms vary however, and are closely related to a person's age or underlying illnesses.  The most common symptoms associated with an upper respiratory infection are nasal discharge or congestion, cough, sneezing, headache and pressure in the ears and face.  These symptoms usually persist for about 3 to 10 days, but can last up to 2 weeks.  It is important to know that upper respiratory infections do not cause serious illness or complications in most cases.    Upper respiratory infections can be transmitted from person to person, with the most common method of transmission being a person's hands.  The virus is able to live on the skin and can infect other persons for up to 2 hours after direct contact.  Also, these can be transmitted when someone coughs or sneezes; thus, it is important to cover the mouth to reduce this risk.  To keep the spread of the illness at bay, good hand hygiene is very important.  This is an infection that is most likely caused by a virus. There are no specific treatments other than to help you with the symptoms until the infection runs its course.  We are sorry you are not feeling well.  Here is how we plan to help!   For nasal congestion, you may use an oral decongestants such as Mucinex D or if you have glaucoma or high blood pressure use plain Mucinex.  Saline nasal spray or nasal drops can help and can safely be used as often as needed for congestion.  For your congestion,  I have prescribed Fluticasone nasal spray one spray in each nostril twice a day and zyrtec.   The amoxacillin 500 mg should have cleared any infection in your ear. Try this flonase and zyrtec and see if your symptoms improve.   If you do not have a history of heart disease, hypertension, diabetes or thyroid disease, prostate/bladder issues or glaucoma, you may also use Sudafed to treat nasal congestion.  It is highly recommended that you consult with a pharmacist or your primary care physician to ensure this medication is safe for you to take.     If you have a cough, you may use cough suppressants such as Delsym and Robitussin.  If you have glaucoma or high blood pressure, you can also use Coricidin HBP.     If you have a sore or scratchy throat, use a saltwater gargle-  to  teaspoon of salt dissolved in a 4-ounce to 8-ounce glass of warm water.  Gargle the solution for approximately 15-30 seconds and then spit.  It is important not to swallow the solution.  You can also use throat lozenges/cough drops and Chloraseptic spray to help with throat pain or discomfort.  Warm or cold liquids can also be helpful in relieving throat pain.  For headache, pain or general discomfort, you can use Ibuprofen or Tylenol as directed.   Some authorities believe that zinc sprays or the use  of Echinacea may shorten the course of your symptoms.   HOME CARE . Only take medications as instructed by your medical team. . Be sure to drink plenty of fluids. Water is fine as well as fruit juices, sodas and electrolyte beverages. You may want to stay away from caffeine or alcohol. If you are nauseated, try taking small sips of liquids. How do you know if you are getting enough fluid? Your urine should be a pale yellow or almost colorless. . Get rest. . Taking a steamy shower or using a humidifier may help nasal congestion and ease sore throat pain. You can place a towel over your head and breathe in the steam from hot  water coming from a faucet. . Using a saline nasal spray works much the same way. . Cough drops, hard candies and sore throat lozenges may ease your cough. . Avoid close contacts especially the very young and the elderly . Cover your mouth if you cough or sneeze . Always remember to wash your hands.   GET HELP RIGHT AWAY IF: . You develop worsening fever. . If your symptoms do not improve within 10 days . You develop yellow or green discharge from your nose over 3 days. . You have coughing fits . You develop a severe head ache or visual changes. . You develop shortness of breath, difficulty breathing or start having chest pain . Your symptoms persist after you have completed your treatment plan  MAKE SURE YOU   Understand these instructions.  Will watch your condition.  Will get help right away if you are not doing well or get worse.  Your e-visit answers were reviewed by a board certified advanced clinical practitioner to complete your personal care plan. Depending upon the condition, your plan could have included both over the counter or prescription medications. Please review your pharmacy choice. If there is a problem, you may call our nursing hot line at and have the prescription routed to another pharmacy. Your safety is important to [place]. If you have drug allergies check your prescription carefully.   You can use MyChart to ask questions about today's visit, request a non-urgent call back, or ask for a work or school excuse for 24 hours related to this e-Visit. If it has been greater than 24 hours you will need to follow up with your provider, or enter a new e-Visit to address those concerns. You will get an e-mail in the next two days asking about your experience.  I hope that your e-visit has been valuable and will speed your recovery. Thank you for using e-visits.

## 2020-08-01 ENCOUNTER — Other Ambulatory Visit: Payer: Self-pay | Admitting: Family

## 2020-08-01 NOTE — Progress Notes (Signed)
Approximately 5 minutes was spent documenting and reviewing patient's chart.   

## 2020-08-17 ENCOUNTER — Ambulatory Visit (HOSPITAL_COMMUNITY)
Admission: EM | Admit: 2020-08-17 | Discharge: 2020-08-17 | Disposition: A | Discharge status: Home / Self Care | Payer: 59 | Attending: Physician Assistant | Admitting: Physician Assistant

## 2020-08-17 ENCOUNTER — Ambulatory Visit (INDEPENDENT_AMBULATORY_CARE_PROVIDER_SITE_OTHER): Payer: 59

## 2020-08-17 ENCOUNTER — Encounter (HOSPITAL_COMMUNITY): Payer: Self-pay

## 2020-08-17 DIAGNOSIS — Z8616 Personal history of COVID-19: Secondary | ICD-10-CM | POA: Diagnosis not present

## 2020-08-17 DIAGNOSIS — J209 Acute bronchitis, unspecified: Secondary | ICD-10-CM

## 2020-08-17 DIAGNOSIS — R0602 Shortness of breath: Secondary | ICD-10-CM | POA: Diagnosis not present

## 2020-08-17 MED ORDER — BENZONATATE 200 MG PO CAPS
200.0000 mg | ORAL_CAPSULE | Freq: Three times a day (TID) | ORAL | 0 refills | Status: DC | PRN
Start: 2020-08-17 — End: 2020-09-05

## 2020-08-17 MED ORDER — ALBUTEROL SULFATE HFA 108 (90 BASE) MCG/ACT IN AERS: 1.0000 | INHALATION_SPRAY | Freq: Four times a day (QID) | RESPIRATORY_TRACT | 0 refills | Status: DC | PRN

## 2020-08-17 MED ORDER — PREDNISONE 50 MG PO TABS
50.0000 mg | ORAL_TABLET | Freq: Every day | ORAL | 0 refills | Status: DC
Start: 2020-08-17 — End: 2020-08-24

## 2020-08-17 MED ORDER — DOXYCYCLINE HYCLATE 100 MG PO CAPS
100.0000 mg | ORAL_CAPSULE | Freq: Two times a day (BID) | ORAL | 0 refills | Status: DC
Start: 2020-08-17 — End: 2020-08-24

## 2020-08-17 NOTE — Discharge Instructions (Addendum)
Chest x-ray negative for pneumonia.  Start prednisone, doxycycline for bronchitis.  Albuterol as needed.  Tessalon for cough.  Follow-up with PCP if symptoms not improving.  If develop chest pain, worsening shortness of breath, go to the emergency department for further evaluation.

## 2020-08-17 NOTE — ED Provider Notes (Signed)
[facility]    CSN: [id number] Arrival date & time: 08/17/20  4098      History   Chief Complaint No chief complaint on file.   HPI Theresa Carlson is a 30 y.o. female.   30 year old female comes in for worsening cough, shortness of breath after being diagnosed with Covid 08/05/2020.  Her symptoms had started to improve until few days ago, where her cough has worsened, and now with shortness of breath.  Denies chest pain, fever.  Mild postnasal drainage.     Past Medical History:  Diagnosis Date  . Asthma    as a child  . Missed abortion 02/11/2019  . S/P dilatation and curettage 02/11/2019  . SVD (spontaneous vaginal delivery) 12/17/2019    Patient Active Problem List   Diagnosis Date Noted  . Normal pregnancy in third trimester 12/17/2019  . SVD (spontaneous vaginal delivery) 12/17/2019  . Tachycardia 09/01/2019  . Missed abortion 02/11/2019  . S/P dilatation and curettage 02/11/2019  . Dyslipidemia 08/13/2018  . Family history of diabetes mellitus 08/13/2018  . Abscess 04/02/2018  . 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 Avulsion fracture of tibial tuberosity 02/13/2018  . Knee pain, right 01/21/2018  . Well adult exam 05/07/2016  . Acute URI 06/23/2015    Past Surgical History:  Procedure Laterality Date  . DILATION AND EVACUATION N/A 02/11/2019   Procedure: DILATATION AND EVACUATION;  Surgeon: Sherian Rein, MD;  Location: [facility] OR;  Service: Gynecology;  Laterality: N/A;  . trigger thumb      OB History    Gravida  2   Para  1   Term  1   Preterm      AB  1   Living  1     SAB  1   IAB      Ectopic      Multiple  0   Live Births  1            Home Medications    Prior to Admission medications   Medication Sig Start Date End Date Taking? Authorizing Provider  albuterol (VENTOLIN HFA) 108 (90 Base) MCG/ACT inhaler Inhale 1-2 puffs into the lungs every 6 (six) hours as needed for wheezing or shortness of breath. 08/17/20  Yes Ahnika Hannibal,  Yamilette Garretson V, PA-C  doxycycline (VIBRAMYCIN) 100 MG capsule Take 1 capsule (100 mg total) by mouth 2 (two) times daily. 08/17/20  Yes Kiyona Mcnall V, PA-C  predniSONE (DELTASONE) 50 MG tablet Take 1 tablet (50 mg total) by mouth daily with breakfast. 08/17/20  Yes Jjesus Dingley V, PA-C  benzonatate (TESSALON) 200 MG capsule Take 1 capsule (200 mg total) by mouth 3 (three) times daily as needed. 08/17/20   Cathie Hoops, Corran Lalone V, PA-C  cetirizine (ZYRTEC) 10 MG tablet Take 1 tablet (10 mg total) by mouth daily. 07/25/20   Junie Spencer, FNP  Digestive Aids Mixture (DIGESTION GB PO) Take 1 tablet by mouth daily.     [provider]  fluticasone (FLONASE) 50 MCG/ACT nasal spray Place 2 sprays into both nostrils daily. 07/25/20   [name] A, FNP  Folic Acid (FOLATE PO) Take 1 tablet by mouth daily.     [provider]  ibuprofen (ADVIL) 600 MG tablet Take 1 tablet (600 mg total) by mouth every 6 (six) hours as needed. 12/19/19   Bovard-Stuckert, Augusto Gamble, MD  magic mouthwash SOLN Take 5 mLs by mouth 4 (four) times daily as needed for mouth pain. Please mix equal parts Maalox  Extra Strength, Nystain and Diphenhydramine; 05/22/20   Olive Bass, FNP  Prenatal Vit-DSS-Fe Cbn-FA (PRENATAL AD PO) Take 1 tablet by mouth daily.     [provider]    Family History Family History  Problem Relation Age of Onset  . Hypertension Mother   . Eczema Mother   . Hypertension Father   . Stroke Maternal Grandfather   . Prostate cancer Maternal Grandfather   . Diabetes Maternal Grandfather   . Breast cancer Paternal Grandmother   . Lung cancer Paternal Grandfather     Social History Social History   Tobacco Use  . Smoking status: Never Smoker  . Smokeless tobacco: Never Used  Vaping Use  . Vaping Use: Never used  Substance Use Topics  . Alcohol use: Not Currently    Alcohol/week: 1.0 standard drink    Types: 1 Glasses of wine per week    Comment: occasonally   . Drug use: No     Allergies    Patient has no known allergies.   Review of Systems Review of Systems  Reason unable to perform ROS: See HPI as above.     Physical Exam Triage Vital Signs ED Triage Vitals  Enc Vitals Group     BP 08/17/20 0829 125/65     Pulse Rate 08/17/20 0829 (!) 104     Resp 08/17/20 0829 18     Temp 08/17/20 0829 98.3 F (36.8 C)     Temp Source 08/17/20 0829 Oral     SpO2 08/17/20 0829 97 %     Weight --      Height --      Head Circumference --      Peak Flow --      Pain Score 08/17/20 0827 0     Pain Loc --      Pain Edu? --      Excl. in GC? --    No data found.  Updated Vital Signs BP 125/65 (BP Location: Left Arm)   Pulse (!) 104   Temp 98.3 F (36.8 C) (Oral)   Resp 18   LMP 08/03/2020   SpO2 97%   Physical Exam Constitutional:      General: She is not in acute distress.    Appearance: Normal appearance. She is well-developed. She is not ill-appearing, toxic-appearing or diaphoretic.  HENT:     Head: Normocephalic and atraumatic.     Right Ear: Tympanic membrane, ear canal and external ear normal. Tympanic membrane is not erythematous or bulging.     Left Ear: Tympanic membrane, ear canal and external ear normal. Tympanic membrane is not erythematous or bulging.     Nose:     Right Sinus: No maxillary sinus tenderness or frontal sinus tenderness.     Left Sinus: No maxillary sinus tenderness or frontal sinus tenderness.     Mouth/Throat:     Mouth: Mucous membranes are moist.     Pharynx: Oropharynx is clear. Uvula midline.  Eyes:     Conjunctiva/sclera: Conjunctivae normal.     Pupils: Pupils are equal, round, and reactive to light.  Cardiovascular:     Rate and Rhythm: Normal rate and regular rhythm.  Pulmonary:     Effort: Pulmonary effort is normal. No accessory muscle usage, prolonged expiration, respiratory distress or retractions.     Breath sounds: No decreased air movement or transmitted upper airway sounds. No decreased breath sounds.      Comments: LCTAB Musculoskeletal:     Cervical  back: Normal range of motion and neck supple.  Skin:    General: Skin is warm and dry.  Neurological:     Mental Status: She is alert and oriented to person, place, and time.      UC Treatments / Results  Labs (all labs ordered are listed, but only abnormal results are displayed) Labs Reviewed - No data to display  EKG   Radiology DG Chest 2 View  Result Date: 08/17/2020 CLINICAL DATA:  Prior COVID.  Shortness of breath. EXAM: CHEST - 2 VIEW COMPARISON:  None. FINDINGS: The heart size and mediastinal contours are within normal limits. Both lungs are clear. The visualized skeletal structures are unremarkable. IMPRESSION: Negative. Electronically Signed   By: Charlett Nose M.D.   On: 08/17/2020 09:06    Procedures Procedures (including critical care time)  Medications Ordered in UC Medications - No data to display  Initial Impression / Assessment and Plan / UC Course  I have reviewed the triage vital signs and the nursing notes.  Pertinent labs & imaging results that were available during my care of the patient were reviewed by me and considered in my medical decision making (see chart for details).    Chest x-ray negative for active cardiopulmonary disease.  Will treat for bronchitis with doxycycline, prednisone.  Albuterol, Tessalon as needed.  Return precautions given.  Patient expresses understanding and agrees to plan.  Final Clinical Impressions(s) / UC Diagnoses   Final diagnoses:  Acute bronchitis, unspecified organism   ED Prescriptions    Medication Sig Dispense Auth. Provider   benzonatate (TESSALON) 200 MG capsule Take 1 capsule (200 mg total) by mouth 3 (three) times daily as needed. 21 capsule Damieon Armendariz V, PA-C   predniSONE (DELTASONE) 50 MG tablet Take 1 tablet (50 mg total) by mouth daily with breakfast. 5 tablet Crysten Kaman V, PA-C   doxycycline (VIBRAMYCIN) 100 MG capsule Take 1 capsule (100 mg total) by mouth 2  (two) times daily. 10 capsule Lochlyn Zullo V, PA-C   albuterol (VENTOLIN HFA) 108 (90 Base) MCG/ACT inhaler Inhale 1-2 puffs into the lungs every 6 (six) hours as needed for wheezing or shortness of breath. 18 g Belinda Fisher, PA-C     PDMP not reviewed this encounter.   Belinda Fisher, PA-C 08/17/20 [phone]

## 2020-08-17 NOTE — ED Triage Notes (Signed)
Pt presents with concerns of pneumonia. Pt states she coughs and has SOB. She states the cough keeps her up at night. She is requesting a chest x-ray.

## 2020-08-24 ENCOUNTER — Telehealth (INDEPENDENT_AMBULATORY_CARE_PROVIDER_SITE_OTHER): Payer: 59 | Admitting: Adult Health

## 2020-08-24 ENCOUNTER — Other Ambulatory Visit: Payer: Self-pay | Admitting: Adult Health

## 2020-08-24 ENCOUNTER — Encounter: Payer: Self-pay | Admitting: Adult Health

## 2020-08-24 VITALS — Wt 165.0 lb

## 2020-08-24 DIAGNOSIS — J209 Acute bronchitis, unspecified: Secondary | ICD-10-CM

## 2020-08-24 MED ORDER — PREDNISONE 10 MG PO TABS: ORAL_TABLET | ORAL | 0 refills | Status: DC

## 2020-08-24 MED ORDER — GUAIFENESIN-CODEINE 100-10 MG/5ML PO SOLN: 10.0000 mL | Freq: Three times a day (TID) | ORAL | 0 refills | Status: DC | PRN

## 2020-08-24 MED FILL — GUAIATUSSIN AC LIQUID: 100-10 | 4 days supply | Qty: 120 | Fill #0

## 2020-08-24 MED FILL — predniSONE 10 MG TABS: 10 | 9 days supply | Qty: 21 | Fill #0

## 2020-08-24 NOTE — Progress Notes (Signed)
Virtual Visit via Video Note  I connected with Theresa Carlson  on 08/24/20 at 11:30 AM EST by a video enabled telemedicine application and verified that I am speaking with the correct person using two identifiers.  Location patient: home Location provider:work or home office Persons participating in the virtual visit: patient, provider  I discussed the limitations of evaluation and management by telemedicine and the availability of in person appointments. The patient expressed understanding and agreed to proceed.   HPI: She is a pleasant 30 year old female who  has a past medical history of Asthma, Missed abortion (02/11/2019), S/P dilatation and curettage (02/11/2019), and SVD (spontaneous vaginal delivery) (12/17/2019).  He was diagnosed with Covid on August 05, 2020.  Originally her symptoms are improving but then developed a worsening cough and shortness of breath.  She was seen in urgent care on 08/17/2020 she was treated for acute bronchitis with a 5-day course of doxycycline, prednisone 50 mg daily x5 days, albuterol, and Tessalon Perles.  Her x-ray at this time was negative for acute cardiopulmonary disease.  Prior to that at the end of last month she was placed on Amoxicillin for URI   Today she reports that she has finished her prednisone therapy as well as the doxycycline therapy earlier this week, she was starting to feel improved while taking the medication but since the beginning of the week her symptoms have progressively worsened.  Currently with a hoarse voice, experiencing episodes of diaphoresis and chills, with a semiproductive cough.  She denies shortness of breath when not coughing and has not experienced any wheezing.  Tessalon Perles and Albuterol inhaler are not helping  ROS: See pertinent positives and negatives per HPI.  Past Medical History:  Diagnosis Date  . Asthma    as a child  . Missed abortion 02/11/2019  . S/P dilatation and curettage 02/11/2019  . SVD  (spontaneous vaginal delivery) 12/17/2019    Past Surgical History:  Procedure Laterality Date  . DILATION AND EVACUATION N/A 02/11/2019   Procedure: DILATATION AND EVACUATION;  Surgeon: Sherian Rein, MD;  Location: [facility];  Service: Gynecology;  Laterality: N/A;  . trigger thumb      Family History  Problem Relation Age of Onset  . Hypertension Mother   . Eczema Mother   . Hypertension Father   . Stroke Maternal Grandfather   . Prostate cancer Maternal Grandfather   . Diabetes Maternal Grandfather   . Breast cancer Paternal Grandmother   . Lung cancer Paternal Grandfather        Current Outpatient Medications:  .  albuterol (VENTOLIN HFA) 108 (90 Base) MCG/ACT inhaler, Inhale 1-2 puffs into the lungs every 6 (six) hours as needed for wheezing or shortness of breath., Disp: 18 g, Rfl: 0 .  benzonatate (TESSALON) 200 MG capsule, Take 1 capsule (200 mg total) by mouth 3 (three) times daily as needed., Disp: 21 capsule, Rfl: 0 .  cetirizine (ZYRTEC) 10 MG tablet, Take 1 tablet (10 mg total) by mouth daily., Disp: 30 tablet, Rfl: 11 .  Digestive Aids Mixture (DIGESTION GB PO), Take 1 tablet by mouth daily. , Disp: , Rfl:  .  fluticasone (FLONASE) 50 MCG/ACT nasal spray, Place 2 sprays into both nostrils daily., Disp: 16 g, Rfl: 6 .  Folic Acid (FOLATE PO), Take 1 tablet by mouth daily. , Disp: , Rfl:  .  guaiFENesin-codeine 100-10 MG/5ML syrup, Take 10 mLs by mouth 3 (three) times daily as needed for cough., Disp: 120 mL, Rfl:  0 .  ibuprofen (ADVIL) 600 MG tablet, Take 1 tablet (600 mg total) by mouth every 6 (six) hours as needed., Disp: 45 tablet, Rfl: 1 .  magic mouthwash SOLN, Take 5 mLs by mouth 4 (four) times daily as needed for mouth pain. Please mix equal parts Maalox Extra Strength, Nystain and Diphenhydramine;, Disp: 120 mL, Rfl: 0 .  predniSONE (DELTASONE) 10 MG tablet, 40 mg x 3 days, 20 mg x 3 days, 10 mg x 3 days, Disp: 21 tablet, Rfl: 0 .  Prenatal Vit-DSS-Fe  Cbn-FA (PRENATAL AD PO), Take 1 tablet by mouth daily. , Disp: , Rfl:   EXAM:  VITALS per patient if applicable:  GENERAL: alert, oriented, appears tired.   HEENT: atraumatic, conjunttiva clear, no obvious abnormalities on inspection of external nose and ears  NECK: normal movements of the head and neck  LUNGS: on inspection no signs of respiratory distress, breathing rate appears normal, no obvious gross SOB, gasping or wheezing. Dry cough present throughout video visit.   CV: no obvious cyanosis  MS: moves all visible extremities without noticeable abnormality  PSYCH/NEURO: pleasant and cooperative, no obvious depression or anxiety, speech and thought processing grossly intact  ASSESSMENT AND PLAN:  Discussed the following assessment and plan:  1. Acute bronchitis, unspecified organism -She has been on 2 rounds of antibiotics in the last month.  We will stay away from antibiotics at this time.  We will provide a longer course of a zone and send an cough syrup.  Advise follow-up if no improvement with this treatment  - predniSONE (DELTASONE) 10 MG tablet; 40 mg x 3 days, 20 mg x 3 days, 10 mg x 3 days  Dispense: 21 tablet; Refill: 0 - guaiFENesin-codeine 100-10 MG/5ML syrup; Take 10 mLs by mouth 3 (three) times daily as needed for cough.  Dispense: 120 mL; Refill: 0     I discussed the assessment and treatment plan with the patient. The patient was provided an opportunity to ask questions and all were answered. The patient agreed with the plan and demonstrated an understanding of the instructions.   The patient was advised to call back or seek an in-person evaluation if the symptoms worsen or if the condition fails to improve as anticipated.   Shirline Frees, NP

## 2020-09-04 ENCOUNTER — Encounter: Payer: Self-pay | Admitting: Adult Health

## 2020-09-05 ENCOUNTER — Other Ambulatory Visit: Payer: Self-pay | Admitting: Family

## 2020-09-05 ENCOUNTER — Telehealth (INDEPENDENT_AMBULATORY_CARE_PROVIDER_SITE_OTHER): Payer: 59 | Admitting: Family

## 2020-09-05 ENCOUNTER — Encounter: Payer: Self-pay | Admitting: Family

## 2020-09-05 DIAGNOSIS — R058 Other specified cough: Secondary | ICD-10-CM | POA: Diagnosis not present

## 2020-09-05 MED ORDER — ADVAIR HFA 230-21 MCG/ACT IN AERO: 2.0000 | INHALATION_SPRAY | Freq: Two times a day (BID) | RESPIRATORY_TRACT | 0 refills | Status: DC

## 2020-09-05 MED ORDER — LEVOFLOXACIN 500 MG PO TABS
500.0000 mg | ORAL_TABLET | Freq: Every day | ORAL | 0 refills | Status: DC
Start: 2020-09-05 — End: 2020-09-05

## 2020-09-05 MED FILL — levoFLOXacin 500 MG TABS: 500 | 7 days supply | Qty: 7 | Fill #0

## 2020-09-05 MED FILL — ADVAIR HFA 230-21 MCG INH: 230-21 | 30 days supply | Qty: 12 | Fill #0

## 2020-09-05 NOTE — Progress Notes (Signed)
Theresa Carlson is a 30 y.o. female with the following history as recorded in [employer]:  Patient Active Problem List   Diagnosis Date Noted  . Normal pregnancy in third trimester 12/17/2019  . SVD (spontaneous vaginal delivery) 12/17/2019  . Tachycardia 09/01/2019  . Missed abortion 02/11/2019  . S/P dilatation and curettage 02/11/2019  . Dyslipidemia 08/13/2018  . Family history of diabetes mellitus 08/13/2018  . Abscess 04/02/2018  . 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 Avulsion fracture of tibial tuberosity 02/13/2018  . Knee pain, right 01/21/2018  . Well adult exam 05/07/2016  . Acute URI 06/23/2015    Current Outpatient Medications  Medication Sig Dispense Refill  . Digestive Aids Mixture (DIGESTION GB PO) Take 1 tablet by mouth daily.     . fluticasone-salmeterol (ADVAIR HFA) 230-21 MCG/ACT inhaler Inhale 2 puffs into the lungs 2 (two) times daily. 1 each 0  . Folic Acid (FOLATE PO) Take 1 tablet by mouth daily.     Marland Kitchen ibuprofen (ADVIL) 600 MG tablet Take 1 tablet (600 mg total) by mouth every 6 (six) hours as needed. 45 tablet 1  . levofloxacin (LEVAQUIN) 500 MG tablet Take 1 tablet (500 mg total) by mouth daily. 7 tablet 0  . Prenatal Vit-DSS-Fe Cbn-FA (PRENATAL AD PO) Take 1 tablet by mouth daily.     Marland Kitchen albuterol (VENTOLIN HFA) 108 (90 Base) MCG/ACT inhaler Inhale 1-2 puffs into the lungs every 6 (six) hours as needed for wheezing or shortness of breath. (Patient not taking: Reported on 09/05/2020) 18 g 0  . fluticasone (FLONASE) 50 MCG/ACT nasal spray Place 2 sprays into both nostrils daily. (Patient not taking: Reported on 09/05/2020) 16 g 6   No current facility-administered medications for this visit.    Allergies: Patient has no known allergies.  Past Medical History:  Diagnosis Date  . Asthma    as a child  . Missed abortion 02/11/2019  . S/P dilatation and curettage 02/11/2019  . SVD (spontaneous vaginal delivery) 12/17/2019    Past Surgical History:  Procedure Laterality Date   . DILATION AND EVACUATION N/A 02/11/2019   Procedure: DILATATION AND EVACUATION;  Surgeon: Sherian Rein, MD;  Location: [facility];  Service: Gynecology;  Laterality: N/A;  . trigger thumb      Family History  Problem Relation Age of Onset  . Hypertension Mother   . Eczema Mother   . Hypertension Father   . Stroke Maternal Grandfather   . Prostate cancer Maternal Grandfather   . Diabetes Maternal Grandfather   . Breast cancer Paternal Grandmother   . Lung cancer Paternal Grandfather     Social History   Tobacco Use  . Smoking status: Never Smoker  . Smokeless tobacco: Never Used  Substance Use Topics  . Alcohol use: Not Currently    Alcohol/week: 1.0 standard drink    Types: 1 Glasses of wine per week    Comment: occasonally     Subjective:   I connected with Theresa Carlson on 09/05/20 at 12:20 PM EST by a video enabled telemedicine application and verified that I am speaking with the correct person using two identifiers.   I discussed the limitations of evaluation and management by telemedicine and the availability of in person appointments. The patient expressed understanding and agreed to proceed. Provider in office/ patient is at home; provider and patient are only 2 people on video call.   Originally diagnosed with bronchitis by e-visit at end of January- given Amoxicillin; then diagnosed with COVID in early  February- progressed into bronchitis; seen at [facility] on [facility]/17 and treated with Doxycycline, Prednisone and Tessalon Perles; Follow up e-visit on 2/22 and re-treated with another prednisone taper and codeine cough syrup; feels like symptoms of bronchitis have again begun to flare again in the past 2-3 days; feels like "everytime I get off the prednisone, my symptoms re-start." history of childhood asthma; allergy testing in 2016;  Normal CXR done 08/17/20;      Objective:  There were no vitals filed for this visit.  General: Well developed, well nourished, in no acute  distress  Head: Normocephalic and atraumatic  Lungs: Respirations unlabored;  Neurologic: Alert and oriented; speech intact; face symmetrical; moves all extremities well;   Assessment:  1. Recurrent cough     Plan:  ? Underlying allergy/ allergic asthma component; CXR is clear; reviewed notes from numerous visits in January and February 2022;  Recommend for her to re-start her Zyrtec and trial of Advair 2 puffs bid; urgent referral to asthma/ allergy for further testing; hesitant to do another round of antibiotics at this time- will send in Levaquin but patient will not start unless symptoms worsen in the next 48-72 hours; follow-up to be determined.  No follow-ups on file.  Orders Placed This Encounter  Procedures  . Ambulatory referral to Allergy    Referral Priority:   Urgent    Referral Type:   Allergy Testing    Referral Reason:   Specialty Services Required    Requested Specialty:   Allergy    Number of Visits Requested:   1    Requested Prescriptions   Signed Prescriptions Disp Refills  . fluticasone-salmeterol (ADVAIR HFA) 230-21 MCG/ACT inhaler 1 each 0    Sig: Inhale 2 puffs into the lungs 2 (two) times daily.  Marland Kitchen levofloxacin (LEVAQUIN) 500 MG tablet 7 tablet 0    Sig: Take 1 tablet (500 mg total) by mouth daily.

## 2020-09-14 ENCOUNTER — Other Ambulatory Visit: Payer: Self-pay

## 2020-09-14 ENCOUNTER — Encounter: Payer: Self-pay | Admitting: Allergy

## 2020-09-14 ENCOUNTER — Ambulatory Visit: Payer: 59 | Admitting: Allergy

## 2020-09-14 VITALS — BP 118/76 | HR 78 | Temp 97.7°F | Resp 18 | Ht 64.0 in | Wt 139.8 lb

## 2020-09-14 DIAGNOSIS — J410 Simple chronic bronchitis: Secondary | ICD-10-CM | POA: Diagnosis not present

## 2020-09-14 NOTE — Progress Notes (Signed)
New Patient Note  RE: Theresa Carlson MRN: [id number] DOB: [birth year] Date of Office Visit: 09/14/2020  Referring provider: Tresa Garter, MD Primary care provider: Tresa Garter, MD  Chief Complaint: Coughing  History of present illness: Theresa Carlson is a 30 y.o. female presenting today for consultation for chronic cough. She is a former patient of the practice with her last visit in October 2016 by Dr. Lucie Carlson.  At that time she did have allergy testing performed that was positive to cat, dog, horse, mold and mouse.  She sates around the end of Jan 2022 she was having bronchitis type symptoms with a productive cough.  She also reports she lost her voice.  She was treated at that time with amoxicillin.  Symptoms got maybe a bit better but then worsened shortly later where the productive cough returned and was much deeper.  She did test positive for Covid at that time and was managed with supportive care. She states the sypmtoms returned again with a productive deep cough and seemed to be quite unrelenting.  She went to [facility] 08/17/20 and requested an CXR that was negative and she was treated with doxycycline and a course of prednisone.  She was also prescribed albuterol inhaler but does not feel has helped relieve symptoms.  While on prednisone she states the symptoms did improve.  But after completion of the prednisone the cough came back within several days.  She then went to her PCP and was provided with a longer prednisone taper. She also prescribed advair inhaler 2 puffs twice a day for about 2 weeks with no difference in the cough.  She was then recommended to take zyrtec which also has not help.  She was also prescribed a course of Levaquin but states she has not taken this.  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  She states she is still having congestion and productive cough  that is deep and still has swollen tonsils.  No wheezing.   She has history of childhood asthma.    Review of systems: Review of Systems  Constitutional: Negative.   HENT: Positive for congestion.   Eyes: Negative.   Respiratory: Positive for cough and sputum production. Negative for wheezing.   Cardiovascular: Negative.   Gastrointestinal: Negative.   Musculoskeletal: Negative.   Skin: Negative.   Neurological: Negative.     All other systems negative unless noted above in HPI  Past medical history: Past Medical History:  Diagnosis Date  . Asthma    as a child  . Missed abortion 02/11/2019  . S/P dilatation and curettage 02/11/2019  . SVD (spontaneous vaginal delivery) 12/17/2019    Past surgical history: Past Surgical History:  Procedure Laterality Date  . DILATION AND EVACUATION N/A 02/11/2019   Procedure: DILATATION AND EVACUATION;  Surgeon: Theresa Rein, MD;  Location: [facility] OR;  Service: Gynecology;  Laterality: N/A;  . trigger thumb      Family history:  Family History  Problem Relation Age of Onset  . Hypertension Mother   . Eczema Mother   . Hypertension Father   . Stroke Maternal Grandfather   . Prostate cancer Maternal Grandfather   . Diabetes Maternal Grandfather   . Breast cancer Paternal Grandmother   . Lung cancer Paternal Grandfather     Social history: Lives in a home without carpeting  with gas heating and central cooling.  Dogs in the home.  There is no concern for water damage, mildew or roaches in the home.  She is a Designer, jewellery in the medical ICU.  She denies a smoking history.  Medication List: Current Outpatient Medications  Medication Sig Dispense Refill  . albuterol (VENTOLIN HFA) 108 (90 Base) MCG/ACT inhaler Inhale 1-2 puffs into the lungs every 6 (six) hours as needed for wheezing or shortness of breath. 18 g 0  . Digestive Aids Mixture (DIGESTION GB PO) Take 1 tablet by mouth daily.     . fluticasone (FLONASE) 50 MCG/ACT  nasal spray Place 2 sprays into both nostrils daily. 16 g 6  . fluticasone-salmeterol (ADVAIR HFA) 230-21 MCG/ACT inhaler Inhale 2 puffs into the lungs 2 (two) times daily. 1 each 0  . Folic Acid (FOLATE PO) Take 1 tablet by mouth daily.     Marland Kitchen ibuprofen (ADVIL) 600 MG tablet Take 1 tablet (600 mg total) by mouth every 6 (six) hours as needed. 45 tablet 1  . levofloxacin (LEVAQUIN) 500 MG tablet Take 1 tablet (500 mg total) by mouth daily. 7 tablet 0  . MILI 0.25-35 MG-MCG tablet Take 1 tablet by mouth at bedtime.    . Prenatal Vit-DSS-Fe Cbn-FA (PRENATAL AD PO) Take 1 tablet by mouth daily.      No current facility-administered medications for this visit.    Known medication allergies: No Known Allergies   Physical examination: Blood pressure 118/76, pulse 78, temperature 97.7 F (36.5 C), resp. rate 18, height 5\' 4"  (1.626 m), weight 139 lb 12.8 oz (63.4 kg), SpO2 97 %, unknown if currently breastfeeding.  General: Alert, interactive, in no acute distress. HEENT: PERRLA, TMs pearly gray, turbinates minimally edematous without discharge, post-pharynx non erythematous. Neck: Supple without lymphadenopathy. Lungs: Clear to auscultation without wheezing, rhonchi or rales. {no increased work of breathing. CV: Normal S1, S2 without murmurs. Abdomen: Nondistended, nontender. Skin: Warm and dry, without lesions or rashes . Extremities:  No clubbing, cyanosis or edema. Neuro:   Grossly intact.  Diagnositics/Labs: Labs/Imaging:  CXR 08/17/2020- personally reviewed and is normal appearing without consolidations  FINDINGS: The heart size and mediastinal contours are within normal limits. Both lungs are clear. The visualized skeletal structures are unremarkable.  IMPRESSION: Negative.  Spirometry: FEV1: 3.06L 95%, FVC: 4.01L 106%, ratio consistent with nonobstructive pattern  Assessment and plan:   Chronic bronchitis - symptoms ongoing > 6 weeks now with productive cough - CXR from  08/2020 was normal - lung function testing today is normal without evidence of obstruction - have had improvement while taking prednisone - will try Breztri triple therapy inhaler at this time which is a step-up from Advair.  Use Breztri 2 puffs twice a day with space.   Both Breztri and spacer device provided today.  Let 08/2020 know if [place] is effective - use albuterol inhaler 2 puffs every 4-6 hours as needed for cough/wheeze/shortness of breath/chest tightness.  May use 15-20 minutes prior to activity.   Monitor frequency of use.   - recommend resuming use of flutter valve 2-3 times a day to help break up mucus to move out of the airway - at this time you have had adequate antibacterial treatment with 2 courses of antibiotic - Mucinex DM may also have some benefit in thinning mucus and help with cough as well.  Use with plenty of water  Follow-up in 6-8 weeks or sooner if needed  I appreciate the opportunity to take part  in Bryahna's care. Please do not hesitate to contact me with questions.  Sincerely,   Margo Aye, MD Allergy/Immunology [facility]

## 2020-09-14 NOTE — Patient Instructions (Addendum)
Chronic bronchitis - symptoms ongoing > 6 weeks now with productive cough - CXR from 08/2020 was normal - lung function testing today is normal without evidence of obstruction - have had improvement while taking prednisone - will try Breztri triple therapy inhaler at this time which is a step-up from Advair.  Use Breztri 2 puffs twice a day with space.   Both Breztri and spacer device provided today.  Let us know if Theresa Carlson is effective - use albuterol inhaler 2 puffs every 4-6 hours as needed for cough/wheeze/shortness of breath/chest tightness.  May use 15-20 minutes prior to activity.   Monitor frequency of use.   - recommend resuming use of flutter valve 2-3 times a day to help break up mucus to move out of the airway - at this time you have had adequate antibacterial treatment with 2 courses of antibiotic - Mucinex DM may also have some benefit in thinning mucus and help with cough as well.  Use with plenty of water  Follow-up in 6-8 weeks or sooner if needed

## 2020-09-25 ENCOUNTER — Ambulatory Visit: Payer: 59 | Admitting: Allergy

## 2020-11-02 ENCOUNTER — Other Ambulatory Visit: Payer: Self-pay

## 2020-11-02 ENCOUNTER — Ambulatory Visit (INDEPENDENT_AMBULATORY_CARE_PROVIDER_SITE_OTHER): Payer: 59 | Admitting: Internal Medicine

## 2020-11-02 ENCOUNTER — Encounter: Payer: Self-pay | Admitting: Internal Medicine

## 2020-11-02 VITALS — BP 102/70 | HR 88 | Temp 98.3°F | Ht 64.0 in | Wt 128.0 lb

## 2020-11-02 DIAGNOSIS — Z0000 Encounter for general adult medical examination without abnormal findings: Secondary | ICD-10-CM | POA: Diagnosis not present

## 2020-11-02 DIAGNOSIS — J4520 Mild intermittent asthma, uncomplicated: Secondary | ICD-10-CM | POA: Diagnosis not present

## 2020-11-02 DIAGNOSIS — T148XXA Other injury of unspecified body region, initial encounter: Secondary | ICD-10-CM

## 2020-11-02 LAB — CBC WITH DIFFERENTIAL/PLATELET
Basophils Absolute: 0 10*3/uL (ref 0.0–0.1)
Basophils Relative: 0.6 % (ref 0.0–3.0)
Eosinophils Absolute: 0.2 10*3/uL (ref 0.0–0.7)
Eosinophils Relative: 3.8 % (ref 0.0–5.0)
HCT: 43.8 % (ref 36.0–46.0)
Hemoglobin: 14.8 g/dL (ref 12.0–15.0)
Lymphocytes Relative: 36.7 % (ref 12.0–46.0)
Lymphs Abs: 1.9 10*3/uL (ref 0.7–4.0)
MCHC: 33.7 g/dL (ref 30.0–36.0)
MCV: 88 fl (ref 78.0–100.0)
Monocytes Absolute: 0.4 10*3/uL (ref 0.1–1.0)
Monocytes Relative: 8.1 % (ref 3.0–12.0)
Neutro Abs: 2.6 10*3/uL (ref 1.4–7.7)
Neutrophils Relative %: 50.8 % (ref 43.0–77.0)
Platelets: 192 10*3/uL (ref 150.0–400.0)
RBC: 4.98 Mil/uL (ref 3.87–5.11)
RDW: 13.1 % (ref 11.5–15.5)
WBC: 5.1 10*3/uL (ref 4.0–10.5)

## 2020-11-02 LAB — COMPREHENSIVE METABOLIC PANEL
ALT: 17 U/L (ref 0–35)
AST: 18 U/L (ref 0–37)
Albumin: 4 g/dL (ref 3.5–5.2)
Alkaline Phosphatase: 49 U/L (ref 39–117)
BUN: 19 mg/dL (ref 6–23)
CO2: 27 mEq/L (ref 19–32)
Calcium: 9.3 mg/dL (ref 8.4–10.5)
Chloride: 105 mEq/L (ref 96–112)
Creatinine, Ser: 0.65 mg/dL (ref 0.40–1.20)
GFR: 119.26 mL/min (ref >60.00)
Glucose, Bld: 87 mg/dL (ref 70–99)
Potassium: 4.4 mEq/L (ref 3.5–5.1)
Sodium: 139 mEq/L (ref 135–145)
Total Bilirubin: 0.4 mg/dL (ref 0.2–1.2)
Total Protein: 7.2 g/dL (ref 6.0–8.3)

## 2020-11-02 LAB — LIPID PANEL
Cholesterol: 152 mg/dL (ref 0–200)
HDL: 65.2 mg/dL (ref >39.00)
LDL Cholesterol: 76 mg/dL (ref 0–99)
NonHDL: 87.06
Total CHOL/HDL Ratio: 2
Triglycerides: 56 mg/dL (ref 0.0–149.0)
VLDL: 11.2 mg/dL (ref 0.0–40.0)

## 2020-11-02 LAB — URINALYSIS
Bilirubin Urine: NEGATIVE
Hgb urine dipstick: NEGATIVE
Ketones, ur: NEGATIVE
Leukocytes,Ua: NEGATIVE
Nitrite: NEGATIVE
Specific Gravity, Urine: <=1.005 — AB (ref 1.000–1.030)
Total Protein, Urine: NEGATIVE
Urine Glucose: NEGATIVE
Urobilinogen, UA: 0.2 (ref 0.0–1.0)
pH: 7 (ref 5.0–8.0)

## 2020-11-02 LAB — VITAMIN D 25 HYDROXY (VIT D DEFICIENCY, FRACTURES): VITD: 72.66 ng/mL (ref 30.00–100.00)

## 2020-11-02 LAB — TSH: TSH: 1.17 u[IU]/mL (ref 0.35–4.50)

## 2020-11-02 MED ORDER — VITAMIN D3 50 MCG (2000 UT) PO CAPS
2000.0000 [IU] | ORAL_CAPSULE | Freq: Every day | ORAL | 3 refills | Status: DC
Start: 2020-11-02 — End: 2020-12-13

## 2020-11-02 NOTE — Progress Notes (Signed)
Subjective:  Patient ID: Theresa Carlson, female    DOB: 1991/02/13  Age: 30 y.o. MRN: [id number]  CC: Annual Exam   HPI Theresa Carlson presents for a well exam C/o bruising Asthma after COVID19 - since Feb 2022. Lost >20 lbs Octavia diet  Outpatient Medications Prior to Visit  Medication Sig Dispense Refill  . Budeson-Glycopyrrol-Formoterol (BREZTRI AEROSPHERE) 160-9-4.8 MCG/ACT AERO Inhale 1 puff into the lungs once a week.    Marland Kitchen MILI 0.25-35 MG-MCG tablet Take 1 tablet by mouth at bedtime.    Marland Kitchen albuterol (VENTOLIN HFA) 108 (90 Base) MCG/ACT inhaler Inhale 1-2 puffs into the lungs every 6 (six) hours as needed for wheezing or shortness of breath. (Patient not taking: Reported on 11/02/2020) 18 g 0  . Digestive Aids Mixture (DIGESTION GB PO) Take 1 tablet by mouth daily.     . fluticasone (FLONASE) 50 MCG/ACT nasal spray Place 2 sprays into both nostrils daily. (Patient not taking: Reported on 11/02/2020) 16 g 6  . fluticasone-salmeterol (ADVAIR HFA) 230-21 MCG/ACT inhaler INHALE 2 PUFFS INTO THE LUNGS 2 (TWO) TIMES DAILY. (Patient not taking: Reported on 11/02/2020) 12 g 0  . Folic Acid (FOLATE PO) Take 1 tablet by mouth daily.  (Patient not taking: Reported on 11/02/2020)    . guaiFENesin-codeine (ROBITUSSIN AC) 100-10 MG/5ML syrup TAKE 10 MLS BY MOUTH 3 (THREE) TIMES DAILY AS NEEDED FOR COUGH. (Patient not taking: Reported on 11/02/2020) 120 mL 0  . ibuprofen (ADVIL) 600 MG tablet Take 1 tablet (600 mg total) by mouth every 6 (six) hours as needed. (Patient not taking: Reported on 11/02/2020) 45 tablet 1  . levofloxacin (LEVAQUIN) 500 MG tablet TAKE 1 TABLET (500 MG TOTAL) BY MOUTH DAILY. (Patient not taking: Reported on 11/02/2020) 7 tablet 0  . nitrofurantoin, macrocrystal-monohydrate, (MACROBID) 100 MG capsule TAKE 1 CAPSULE BY MOUTH EVERY 12 HOURS FOR 5 DAYS. (Patient not taking: Reported on 11/02/2020) 10 capsule 0  . Prenatal Vit-DSS-Fe Cbn-FA (PRENATAL AD PO) Take 1 tablet by mouth daily.  (Patient  not taking: Reported on 11/02/2020)     No facility-administered medications prior to visit.    ROS: Review of Systems  Constitutional: Negative for activity change, appetite change, chills, fatigue and unexpected weight change.  HENT: Negative for congestion, mouth sores and sinus pressure.   Eyes: Negative for visual disturbance.  Respiratory: Negative for cough and chest tightness.   Gastrointestinal: Negative for abdominal pain and nausea.  Genitourinary: Negative for difficulty urinating, frequency and vaginal pain.  Musculoskeletal: Negative for back pain and gait problem.  Skin: Negative for pallor and rash.  Neurological: Negative for dizziness, tremors, weakness, numbness and headaches.  Hematological: Bruises/bleeds easily.  Psychiatric/Behavioral: Negative for confusion and sleep disturbance.    Objective:  BP 102/70 (BP Location: Left Arm)   Pulse 88   Temp 98.3 F (36.8 C) (Oral)   Ht 5\' 4"  (1.626 m)   Wt 128 lb (58.1 kg)   SpO2 97%   BMI 21.97 kg/m   BP Readings from Last 3 Encounters:  11/02/20 102/70  09/14/20 118/76  08/17/20 125/65    Wt Readings from Last 3 Encounters:  11/02/20 128 lb (58.1 kg)  09/14/20 139 lb 12.8 oz (63.4 kg)  08/24/20 165 lb (74.8 kg)    Physical Exam Constitutional:      General: She is not in acute distress.    Appearance: She is well-developed.  HENT:     Head: Normocephalic.     Right Ear: External  ear normal.     Left Ear: External ear normal.     Nose: Nose normal.  Eyes:     General:        Right eye: No discharge.        Left eye: No discharge.     Conjunctiva/sclera: Conjunctivae normal.     Pupils: Pupils are equal, round, and reactive to light.  Neck:     Thyroid: No thyromegaly.     Vascular: No JVD.     Trachea: No tracheal deviation.  Cardiovascular:     Rate and Rhythm: Normal rate and regular rhythm.     Heart sounds: Normal heart sounds.  Pulmonary:     Effort: No respiratory distress.      Breath sounds: No stridor. No wheezing.  Abdominal:     General: Bowel sounds are normal. There is no distension.     Palpations: Abdomen is soft. There is no mass.     Tenderness: There is no abdominal tenderness. There is no guarding or rebound.  Musculoskeletal:        General: No tenderness.     Cervical back: Normal range of motion and neck supple.  Lymphadenopathy:     Cervical: No cervical adenopathy.  Skin:    Findings: No erythema or rash.  Neurological:     Cranial Nerves: No cranial nerve deficit.     Motor: No abnormal muscle tone.     Coordination: Coordination normal.     Deep Tendon Reflexes: Reflexes normal.  Psychiatric:        Behavior: Behavior normal.        Thought Content: Thought content normal.        Judgment: Judgment normal.    Small scattered bruises  Lab Results  Component Value Date   WBC 14.6 (H) 12/18/2019   HGB 10.7 (L) 12/18/2019   HCT 33.4 (L) 12/18/2019   PLT 198 12/18/2019   GLUCOSE 84 10/27/2019   CHOL 273 (H) 10/27/2019   TRIG 127.0 10/27/2019   HDL 93.00 10/27/2019   LDLCALC 155 (H) 10/27/2019   ALT 16 10/27/2019   AST 19 10/27/2019   NA 136 10/27/2019   K 3.7 10/27/2019   CL 104 10/27/2019   CREATININE 0.50 10/27/2019   BUN 10 10/27/2019   CO2 26 10/27/2019   TSH 1.45 10/27/2019   HGBA1C 4.8 08/18/2018    DG Chest 2 View  Result Date: 08/17/2020 CLINICAL DATA:  Prior COVID.  Shortness of breath. EXAM: CHEST - 2 VIEW COMPARISON:  None. FINDINGS: The heart size and mediastinal contours are within normal limits. Both lungs are clear. The visualized skeletal structures are unremarkable. IMPRESSION: Negative. Electronically Signed   By: Charlett Nose M.D.   On: 08/17/2020 09:06    Assessment & Plan:    Sonda Primes, MD

## 2020-11-03 ENCOUNTER — Ambulatory Visit: Payer: 59 | Admitting: Allergy

## 2020-11-04 DIAGNOSIS — J45909 Unspecified asthma, uncomplicated: Secondary | ICD-10-CM | POA: Insufficient documentation

## 2020-11-04 NOTE — Assessment & Plan Note (Signed)
post COVID  On Breztri MDI

## 2020-11-16 ENCOUNTER — Other Ambulatory Visit (HOSPITAL_COMMUNITY): Payer: Self-pay

## 2020-11-16 ENCOUNTER — Telehealth: Payer: 59 | Admitting: Physician Assistant

## 2020-11-16 DIAGNOSIS — R21 Rash and other nonspecific skin eruption: Secondary | ICD-10-CM

## 2020-11-16 MED ORDER — TRIAMCINOLONE ACETONIDE 0.1 % EX CREA
1.0000 "application " | TOPICAL_CREAM | Freq: Two times a day (BID) | CUTANEOUS | 0 refills | Status: DC
  Filled 2020-11-16: qty 30, 15d supply, fill #0

## 2020-11-16 NOTE — Progress Notes (Signed)
E Visit for Rash  We are sorry that you are not feeling well. Here is how we plan to help!  Based on what you shared with me it looks like you have contact dermatitis.  Contact dermatitis is a skin rash caused by something that touches the skin and causes irritation or inflammation.  Your skin may be red, swollen, dry, cracked, and itch.  The rash should go away in a few days but can last a few weeks.  If you get a rash, it's important to figure out what caused it so the irritant can be avoided in the future. I am going to prescribe a topical corticosteroid cream called Triamcinolone. You can apply to affected areas twice daily.  HOME CARE:   Take cool showers and avoid direct sunlight.  Apply cool compress or wet dressings.  Take a bath in an oatmeal bath.  Sprinkle content of one Aveeno packet under running faucet with comfortably warm water.  Bathe for 15-20 minutes, 1-2 times daily.  Pat dry with a towel. Do not rub the rash.  Use hydrocortisone cream.  Take an antihistamine like Benadryl for widespread rashes that itch.  The adult dose of Benadryl is 25-50 mg by mouth 4 times daily.  Caution:  This type of medication may cause sleepiness.  Do not drink alcohol, drive, or operate dangerous machinery while taking antihistamines.  Do not take these medications if you have prostate enlargement.  Read package instructions thoroughly on all medications that you take.  GET HELP RIGHT AWAY IF:   Symptoms don't go away after treatment.  Severe itching that persists.  If you rash spreads or swells.  If you rash begins to smell.  If it blisters and opens or develops a yellow-brown crust.  You develop a fever.  You have a sore throat.  You become short of breath.  MAKE SURE YOU:  Understand these instructions. Will watch your condition. Will get help right away if you are not doing well or get worse.  Thank you for choosing an e-visit. Your e-visit answers were reviewed by a  board certified advanced clinical practitioner to complete your personal care plan. Depending upon the condition, your plan could have included both over the counter or prescription medications. Please review your pharmacy choice. Be sure that the pharmacy you have chosen is open so that you can pick up your prescription now.  If there is a problem you may message your provider in MyChart to have the prescription routed to another pharmacy. Your safety is important to [place]. If you have drug allergies check your prescription carefully.  For the next 24 hours, you can use MyChart to ask questions about today's visit, request a non-urgent call back, or ask for a work or school excuse from your e-visit provider. You will get an email in the next two days asking about your experience. I hope that your e-visit has been valuable and will speed your recovery.  I provided 6 minutes of non face-to-face time during this encounter for chart review and documentation.

## 2020-11-23 ENCOUNTER — Ambulatory Visit: Payer: 59 | Admitting: Allergy

## 2020-11-23 DIAGNOSIS — J309 Allergic rhinitis, unspecified: Secondary | ICD-10-CM

## 2020-12-13 ENCOUNTER — Telehealth: Payer: 59 | Admitting: Nurse Practitioner

## 2020-12-13 DIAGNOSIS — L03012 Cellulitis of left finger: Secondary | ICD-10-CM

## 2020-12-13 DIAGNOSIS — B373 Candidiasis of vulva and vagina: Secondary | ICD-10-CM

## 2020-12-13 DIAGNOSIS — B3731 Acute candidiasis of vulva and vagina: Secondary | ICD-10-CM

## 2020-12-13 MED ORDER — SULFAMETHOXAZOLE-TRIMETHOPRIM 800-160 MG PO TABS
1.0000 | ORAL_TABLET | Freq: Two times a day (BID) | ORAL | 0 refills | Status: DC
  Filled 2020-12-13: qty 14, 7d supply, fill #0

## 2020-12-13 MED ORDER — FLUCONAZOLE 150 MG PO TABS
150.0000 mg | ORAL_TABLET | Freq: Once | ORAL | 0 refills | Status: AC
  Filled 2020-12-13: qty 1, 1d supply, fill #0

## 2020-12-13 NOTE — Progress Notes (Signed)
Virtual Visit Consent   Theresa Carlson, you are scheduled for a virtual visit with a [facility] provider today.     Just as with appointments in the office, your consent must be obtained to participate.  Your consent will be active for this visit and any virtual visit you may have with one of our providers in the next 365 days.     If you have a MyChart account, a copy of this consent can be sent to you electronically.  All virtual visits are billed to your insurance company just like a traditional visit in the office.    As this is a virtual visit, video technology does not allow for your provider to perform a traditional examination.  This may limit your provider's ability to fully assess your condition.  If your provider identifies any concerns that need to be evaluated in person or the need to arrange testing (such as labs, EKG, etc.), we will make arrangements to do so.     Although advances in technology are sophisticated, we cannot ensure that it will always work on either your end or our end.  If the connection with a video visit is poor, the visit may have to be switched to a telephone visit.  With either a video or telephone visit, we are not always able to ensure that we have a secure connection.     I need to obtain your verbal consent now.   Are you willing to proceed with your visit today?    Theresa Carlson has provided verbal consent on 12/13/2020 for a virtual visit (video or telephone).   Theresa Simas, FNP   Date: 12/13/2020 6:34 PM   Virtual Visit via Video Note   I, Theresa Carlson, connected with Theresa Carlson on 12/13/20 at  6:45 PM EDT by a video-enabled telemedicine application and verified that I am speaking with the correct person using two identifiers.  Location: Patient: Virtual Visit Location Patient: Mobile in car outside of workplace Provider: Virtual Visit Location Provider: Office/Clinic   I discussed the limitations of evaluation and management by telemedicine  and the availability of in person appointments. The patient expressed understanding and agreed to proceed.    History of Present Illness: Theresa Carlson is a 30 y.o. who identifies as a female who was assigned female at birth, and is being seen today for an infected finger. She has had S&S nails on and when one came off part of the nail was irritated. She returned to the nail salon to have them cut back the nail a few days ago and since that time the skin just next to the nail has been red and painful.  She no longer has anything attached to her nails.  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   Avulsion fracture of tibial tuberosity 02/13/2018   Knee pain, right 01/21/2018   Well adult exam 05/07/2016   Acute URI 06/23/2015    Allergies: No Known Allergies Medications:  Current Outpatient Medications:    Budeson-Glycopyrrol-Formoterol (BREZTRI AEROSPHERE) 160-9-4.8 MCG/ACT AERO, Inhale 1  puff into the lungs once a week., Disp: , Rfl:    MILI 0.25-35 MG-MCG tablet, Take 1 tablet by mouth at bedtime., Disp: , Rfl:    triamcinolone cream (KENALOG) 0.1 %, Apply 1 application topically 2 (two) times daily., Disp: 30 g, Rfl: 0  Observations/Objective: Patient is well-developed, well-nourished in no acute distress.  Resting comfortably in her car Head is normocephalic, atraumatic.  No labored breathing.  Speech is clear and coherent with logical content.  Patient is alert and oriented at baseline.  Skin just adjacent to thumb nail on left hand with erythema and swelling. No  active drainage noted.   Assessment and Plan: History and assessment consistent with infection to nailbed and surrounding skin. Advised warm Epson salt soaks as often as possible. Dress nailbed with antibiotic ointment and gauze after each soak. Continue this for 48 hours if improving continue until symptom resolution. If no improvement or with any worsening symptoms including redness that is spreading down thumb/closer to hand, inflammation of the joint of pain in joint start oral antibiotic as discussed.   If needing oral antibiotic patient has a history of frequent yeast infections on antibiotics and will be given an as needed diflucan for if yeast symptoms onset.   Follow Up Instructions: I discussed the assessment and treatment plan with the patient. The patient was provided an opportunity to ask questions and all were answered. The patient agreed with the plan and demonstrated an understanding of the instructions.  A copy of instructions were sent to the patient via MyChart.  The patient was advised to call back or seek an in-person evaluation if the symptoms worsen or if the condition fails to improve as anticipated.  Time:  I spent 15 minutes with the patient via telehealth technology discussing the above problems/concerns.    Meds ordered this encounter  Medications   sulfamethoxazole-trimethoprim (BACTRIM DS) 800-160 MG tablet    Sig: Take 1 tablet by mouth 2 (two) times daily for 7 days.    Dispense:  14 tablet    Refill:  0   fluconazole (DIFLUCAN) 150 MG tablet    Sig: Take 1 tablet (150 mg total) by mouth once for 1 dose.    Dispense:  1 tablet    Refill:  0     Theresa Simas, FNP

## 2020-12-14 ENCOUNTER — Other Ambulatory Visit (HOSPITAL_COMMUNITY): Payer: Self-pay

## 2020-12-19 ENCOUNTER — Telehealth: Payer: 59 | Admitting: Physician Assistant

## 2020-12-19 ENCOUNTER — Other Ambulatory Visit (HOSPITAL_COMMUNITY): Payer: Self-pay

## 2020-12-19 DIAGNOSIS — J069 Acute upper respiratory infection, unspecified: Secondary | ICD-10-CM | POA: Diagnosis not present

## 2020-12-19 MED ORDER — BENZONATATE 100 MG PO CAPS
100.0000 mg | ORAL_CAPSULE | Freq: Three times a day (TID) | ORAL | 0 refills | Status: DC | PRN
  Filled 2020-12-19: qty 30, 10d supply, fill #0

## 2020-12-19 MED ORDER — PREDNISONE 10 MG PO TABS
30.0000 mg | ORAL_TABLET | Freq: Every day | ORAL | 0 refills | Status: AC
  Filled 2020-12-19: qty 9, 3d supply, fill #0

## 2020-12-19 NOTE — Progress Notes (Signed)
We are sorry that you are not feeling well.  Here is how we plan to help!  Based on your presentation I believe you most likely have A cough due to a virus.  This is called viral bronchitis and is best treated by rest, plenty of fluids and control of the cough.  You may use Ibuprofen or Tylenol as directed to help your symptoms.     In addition you may use A prescription cough medication called Tessalon Perles 100mg . You may take 1-2 capsules every 8 hours as needed for your cough.  I am giving you a 3-day burst of a low-dose steroid to help with inflammation of the tonsils as well.   From your responses in the eVisit questionnaire you describe inflammation in the upper respiratory tract which is causing a significant cough.  This is commonly called Bronchitis and has four common causes:   Allergies Viral Infections Acid Reflux Bacterial Infection Allergies, viruses and acid reflux are treated by controlling symptoms or eliminating the cause. An example might be a cough caused by taking certain blood pressure medications. You stop the cough by changing the medication. Another example might be a cough caused by acid reflux. Controlling the reflux helps control the cough.  USE OF BRONCHODILATOR ("RESCUE") INHALERS: There is a risk from using your bronchodilator too frequently.  The risk is that over-reliance on a medication which only relaxes the muscles surrounding the breathing tubes can reduce the effectiveness of medications prescribed to reduce swelling and congestion of the tubes themselves.  Although you feel brief relief from the bronchodilator inhaler, your asthma may actually be worsening with the tubes becoming more swollen and filled with mucus.  This can delay other crucial treatments, such as oral steroid medications. If you need to use a bronchodilator inhaler daily, several times per day, you should discuss this with your provider.  There are probably better treatments that could be  used to keep your asthma under control.     HOME CARE Only take medications as instructed by your medical team. Complete the entire course of an antibiotic. Drink plenty of fluids and get plenty of rest. Avoid close contacts especially the very young and the elderly Cover your mouth if you cough or cough into your sleeve. Always remember to wash your hands A steam or ultrasonic humidifier can help congestion.   GET HELP RIGHT AWAY IF: You develop worsening fever. You become short of breath You cough up blood. Your symptoms persist after you have completed your treatment plan MAKE SURE YOU  Understand these instructions. Will watch your condition. Will get help right away if you are not doing well or get worse.  Your e-visit answers were reviewed by a board certified advanced clinical practitioner to complete your personal care plan.  Depending on the condition, your plan could have included both over the counter or prescription medications. If there is a problem please reply  once you have received a response from your provider. Your safety is important to .  If you have drug allergies check your prescription carefully.    You can use MyChart to ask questions about today's visit, request a non-urgent call back, or ask for a work or school excuse for 24 hours related to this e-Visit. If it has been greater than 24 hours you will need to follow up with your provider, or enter a new e-Visit to address those concerns. You will get an e-mail in the next two days asking about your  experience.  I hope that your e-visit has been valuable and will speed your recovery. Thank you for using e-visits.  

## 2020-12-19 NOTE — Progress Notes (Signed)
I have spent 5 minutes in review of e-visit questionnaire, review and updating patient chart, medical decision making and response to patient.   Esperansa Sarabia Cody Trysten Berti, PA-C    

## 2021-01-05 ENCOUNTER — Other Ambulatory Visit (HOSPITAL_COMMUNITY): Payer: Self-pay

## 2021-01-05 ENCOUNTER — Other Ambulatory Visit: Payer: Self-pay

## 2021-01-05 ENCOUNTER — Ambulatory Visit: Payer: 59 | Admitting: Family Medicine

## 2021-01-05 ENCOUNTER — Encounter: Payer: Self-pay | Admitting: Family Medicine

## 2021-01-05 VITALS — BP 98/58 | HR 77 | Temp 97.7°F | Resp 18 | Ht 65.0 in | Wt 131.0 lb

## 2021-01-05 DIAGNOSIS — K219 Gastro-esophageal reflux disease without esophagitis: Secondary | ICD-10-CM

## 2021-01-05 DIAGNOSIS — J302 Other seasonal allergic rhinitis: Secondary | ICD-10-CM | POA: Diagnosis not present

## 2021-01-05 DIAGNOSIS — J410 Simple chronic bronchitis: Secondary | ICD-10-CM

## 2021-01-05 DIAGNOSIS — J3089 Other allergic rhinitis: Secondary | ICD-10-CM

## 2021-01-05 MED ORDER — FAMOTIDINE 20 MG PO TABS
20.0000 mg | ORAL_TABLET | Freq: Two times a day (BID) | ORAL | 5 refills | Status: DC | PRN
  Filled 2021-01-05: qty 60, 30d supply, fill #0

## 2021-01-05 MED ORDER — AIRDUO DIGIHALER 113-14 MCG/ACT IN AEPB
1.0000 | INHALATION_SPRAY | Freq: Two times a day (BID) | RESPIRATORY_TRACT | 5 refills | Status: DC | PRN
  Filled 2021-01-05: qty 1, 30d supply, fill #0

## 2021-01-05 MED ORDER — FLUTICASONE PROPIONATE 50 MCG/ACT NA SUSP
1.0000 | Freq: Every day | NASAL | 5 refills | Status: DC | PRN
  Filled 2021-01-05: qty 16, 60d supply, fill #0

## 2021-01-05 MED ORDER — FLUTICASONE-SALMETEROL 250-50 MCG/ACT IN AEPB
1.0000 | INHALATION_SPRAY | Freq: Two times a day (BID) | RESPIRATORY_TRACT | 5 refills | Status: DC
  Filled 2021-01-05 – 2021-01-17 (×3): qty 60, 30d supply, fill #0

## 2021-01-05 MED ORDER — CETIRIZINE HCL 10 MG PO TABS
10.0000 mg | ORAL_TABLET | Freq: Every day | ORAL | 5 refills | Status: DC
  Filled 2021-01-05: qty 30, 30d supply, fill #0

## 2021-01-05 NOTE — Patient Instructions (Addendum)
Allergic rhinitis Continue allergen avoidance measures directed toward mold and mouse as listed below Begin cetirizine 10 mg once a day as needed for runny nose or itch Begin Flonase 1 to 2 sprays in each nostril once a day as needed for a stuffy nose or postnasal drainage Consider saline nasal rinses as needed for nasal symptoms. Use this before any medicated nasal sprays for best result  Cough For cough, beginAirDuo Digihaler 113-1 puff twice a day for 2 weeks or until cough or wheeze free  Reflux Continue dietary and lifestyle modifications as listed below Begin famotidine 20 mg twice a day to control reflux if needed  Call the clinic if this treatment plan is not working well for you  Follow up in 2 months or sooner if needed.  Control of Mold Allergen Mold and fungi can grow on a variety of surfaces provided certain temperature and moisture conditions exist.  Outdoor molds grow on plants, decaying vegetation and soil.  The major outdoor mold, Alternaria and Cladosporium, are found in very high numbers during hot and dry conditions.  Generally, a late Summer - Fall peak is seen for common outdoor fungal spores.  Rain will temporarily lower outdoor mold spore count, but counts rise rapidly when the rainy period ends.  The most important indoor molds are Aspergillus and Penicillium.  Dark, humid and poorly ventilated basements are ideal sites for mold growth.  The next most common sites of mold growth are the bathroom and the kitchen.  Outdoor [employer] Use air conditioning and keep windows closed Avoid exposure to decaying vegetation. Avoid leaf raking. Avoid grain handling. Consider wearing a face mask if working in moldy areas.  Indoor Mold Control Maintain humidity below 50%. Clean washable surfaces with 5% bleach solution. Remove sources e.g. Contaminated carpets.

## 2021-01-05 NOTE — Progress Notes (Signed)
[address] [name] [address] Dept: [phone]  FOLLOW UP NOTE  Patient ID: Theresa Carlson, female    DOB: 1991/02/07  Age: 30 y.o. MRN: [id number] Date of Office Visit: 01/05/2021  Assessment  Chief Complaint: Allergic Rhinitis  and Follow-up  HPI Theresa Carlson is a 30 year old female who presents to the clinic for follow-up visit.  She was last seen in this clinic on 09/14/2020 by Dr. Delorse Lek for evaluation of simple chronic bronchitis.  At today's visit, she reports that her symptoms remain largely unchanged since her last visit.  She reports no shortness of breath or wheeze with activity or rest.  She does report cycle of coughing that is a mixture of dry cough and cough producing mucus occurring after every upper respiratory infection and lasting for about 3 weeks.  She reports this cough clears with prednisone and returns when the prednisone is gone.  She has tried Clinical cytogeneticist with moderate improvement and reports that albuterol does not relieve her symptoms at all.  She denies symptoms of reflux including heartburn or vomiting and is not currently taking a medication to control reflux.  Allergic rhinitis is reported as moderately well controlled with some postnasal drainage and throat clearing.  She did have allergy testing in 2016 that was positive to seasonal and perennial molds and mouse.  Her current medications are listed in the chart.   Drug Allergies:  No Known Allergies  Physical Exam: BP (!) 98/58 (BP Location: Left Arm, Patient Position: Sitting, Cuff Size: Normal)   Pulse 77   Temp 97.7 F (36.5 C) (Temporal)   Resp 18   Ht 5\' 5"  (1.651 m)   Wt 131 lb (59.4 kg)   SpO2 98%   BMI 21.80 kg/m    Physical Exam Vitals reviewed.  Constitutional:      Appearance: Normal appearance.  HENT:     Head: Normocephalic and atraumatic.     Right Ear: Tympanic membrane normal.     Left Ear: Tympanic membrane normal.     Nose:     Comments: Bilateral nares slightly  erythematous with clear nasal drainage noted.  Pharynx normal.  Ears normal.  Eyes normal.    Mouth/Throat:     Pharynx: Oropharynx is clear.  Eyes:     Conjunctiva/sclera: Conjunctivae normal.  Cardiovascular:     Rate and Rhythm: Normal rate and regular rhythm.     Heart sounds: Normal heart sounds. No murmur heard. Pulmonary:     Effort: Pulmonary effort is normal.     Breath sounds: Normal breath sounds.     Comments: Lungs clear to auscultation Musculoskeletal:        General: Normal range of motion.     Cervical back: Normal range of motion and neck supple.  Skin:    General: Skin is warm and dry.  Neurological:     Mental Status: She is alert and oriented to person, place, and time.  Psychiatric:        Mood and Affect: Mood normal.        Behavior: Behavior normal.        Thought Content: Thought content normal.        Judgment: Judgment normal.    Diagnostics: FVC 3.98, FEV1 3.40.  Predicted FVC 4.05, predicted FEV1 3.41.  Spirometry indicates normal ventilatory function.  Assessment and Plan: 1. Simple chronic bronchitis (HCC)   2. Seasonal and perennial allergic rhinitis   3. Gastroesophageal reflux disease, unspecified whether esophagitis present  Meds ordered this encounter  Medications   DISCONTD: Fluticasone-Salmeterol,sensor, (AIRDUO DIGIHALER) 113-14 MCG/ACT AEPB    Sig: Inhale 1 puff into the lungs 2 (two) times daily as needed.    Dispense:  1 each    Refill:  5   cetirizine (ZYRTEC) 10 MG tablet    Sig: Take 1 tablet (10 mg total) by mouth daily.    Dispense:  30 tablet    Refill:  5   fluticasone (FLONASE) 50 MCG/ACT nasal spray    Sig: Place 1 spray into both nostrils daily as needed for allergies or rhinitis.    Dispense:  16 g    Refill:  5   famotidine (PEPCID) 20 MG tablet    Sig: Take 1 tablet (20 mg total) by mouth 2 (two) times daily as needed for heartburn or indigestion.    Dispense:  60 tablet    Refill:  5    fluticasone-salmeterol (ADVAIR DISKUS) 250-50 MCG/ACT AEPB    Sig: Inhale 1 puff into the lungs in the morning and at bedtime.    Dispense:  60 each    Refill:  5    Patient Instructions  Allergic rhinitis Continue allergen avoidance measures directed toward mold and mouse as listed below Begin cetirizine 10 mg once a day as needed for runny nose or itch Begin Flonase 1 to 2 sprays in each nostril once a day as needed for a stuffy nose or postnasal drainage Consider saline nasal rinses as needed for nasal symptoms. Use this before any medicated nasal sprays for best result  Cough For cough, beginAirDuo Digihaler 113-1 puff twice a day for 2 weeks or until cough or wheeze free  Reflux Continue dietary and lifestyle modifications as listed below Begin famotidine 20 mg twice a day to control reflux if needed  Call the clinic if this treatment plan is not working well for you  Follow up in 2 months or sooner if needed.   Return in about 2 months (around 03/08/2021), or if symptoms worsen or fail to improve.    Thank you for the opportunity to care for this patient.  Please do not hesitate to contact me with questions.  Thermon Leyland, FNP [facility]

## 2021-01-06 ENCOUNTER — Encounter: Payer: Self-pay | Admitting: Family Medicine

## 2021-01-15 ENCOUNTER — Other Ambulatory Visit (HOSPITAL_COMMUNITY): Payer: Self-pay

## 2021-01-17 ENCOUNTER — Other Ambulatory Visit (HOSPITAL_COMMUNITY): Payer: Self-pay

## 2021-01-27 ENCOUNTER — Telehealth: Payer: 59 | Admitting: Physician Assistant

## 2021-01-27 DIAGNOSIS — J019 Acute sinusitis, unspecified: Secondary | ICD-10-CM

## 2021-01-28 ENCOUNTER — Encounter: Payer: Self-pay | Admitting: Physician Assistant

## 2021-01-28 MED ORDER — BENZONATATE 100 MG PO CAPS: 100.0000 mg | ORAL_CAPSULE | Freq: Two times a day (BID) | ORAL | 0 refills | Status: DC | PRN

## 2021-01-28 MED ORDER — AMOXICILLIN-POT CLAVULANATE 875-125 MG PO TABS: 1.0000 | ORAL_TABLET | Freq: Two times a day (BID) | ORAL | 0 refills | Status: DC

## 2021-01-28 MED ORDER — PREDNISONE 10 MG PO TABS: ORAL_TABLET | ORAL | 0 refills | Status: DC

## 2021-01-28 NOTE — Progress Notes (Signed)
We are sorry that you are not feeling well.  Here is how we plan to help!  Based on your presentation I believe you most likely have A cough due to bronchitis likely caused by a virus.   This is called viral bronchitis and is best treated by rest, plenty of fluids and control of the cough.  You may use Ibuprofen or Tylenol as directed to help your symptoms.  Please note that if you currently have wheezing, and due to your history of asthma, that you may also have flare of  your asthma.   In addition you may use A prescription cough medication called Tessalon Perles 100mg . You may take 1-2 capsules every 8 hours as needed for your cough.  Prednisone 10 mg daily for 6 days (see taper instructions below)  Directions for 6 day taper: Day 1: 2 tablets before breakfast, 1 after both lunch & dinner and 2 at bedtime Day 2: 1 tab before breakfast, 1 after both lunch & dinner and 2 at bedtime Day 3: 1 tab at each meal & 1 at bedtime Day 4: 1 tab at breakfast, 1 at lunch, 1 at bedtime Day 5: 1 tab at breakfast & 1 tab at bedtime Day 6: 1 tab at breakfast  From your responses in the eVisit questionnaire you describe inflammation in the upper respiratory tract which is causing a significant cough.  This is commonly called Bronchitis and has four common causes:   Allergies Viral Infections Acid Reflux Bacterial Infection Allergies, viruses and acid reflux are treated by controlling symptoms or eliminating the cause. An example might be a cough caused by taking certain blood pressure medications. You stop the cough by changing the medication. Another example might be a cough caused by acid reflux. Controlling the reflux helps control the cough.  USE OF BRONCHODILATOR ("RESCUE") INHALERS: There is a risk from using your bronchodilator too frequently.  The risk is that over-reliance on a medication which only relaxes the muscles surrounding the breathing tubes can reduce the effectiveness of medications  prescribed to reduce swelling and congestion of the tubes themselves.  Although you feel brief relief from the bronchodilator inhaler, your asthma may actually be worsening with the tubes becoming more swollen and filled with mucus.  This can delay other crucial treatments, such as oral steroid medications. If you need to use a bronchodilator inhaler daily, several times per day, you should discuss this with your provider.  There are probably better treatments that could be used to keep your asthma under control.     HOME CARE Only take medications as instructed by your medical team. Complete the entire course of an antibiotic. Drink plenty of fluids and get plenty of rest. Avoid close contacts especially the very young and the elderly Cover your mouth if you cough or cough into your sleeve. Always remember to wash your hands A steam or ultrasonic humidifier can help congestion.   GET HELP RIGHT AWAY IF: You develop worsening fever. You become short of breath You cough up blood. Your symptoms persist after you have completed your treatment plan MAKE SURE YOU  Understand these instructions. Will watch your condition. Will get help right away if you are not doing well or get worse. E-Visit for Sinus Problems  We are sorry that you are not feeling well.  Here is how we plan to help!  Based on what you have shared with me it looks like you have sinusitis.  Sinusitis is inflammation and infection in  the sinus cavities of the head.  Based on your presentation I believe you most likely have Acute Bacterial Sinusitis.  This is an infection caused by bacteria and is treated with antibiotics. I have prescribed Augmentin 875mg /125mg  one tablet twice daily with food, for 7 days. You may use an oral decongestant such as Mucinex D or if you have glaucoma or high blood pressure use plain Mucinex. Saline nasal spray help and can safely be used as often as needed for congestion.  If you develop worsening  sinus pain, fever or notice severe headache and vision changes, or if symptoms are not better after completion of antibiotic, please schedule an appointment with a health care provider.    Sinus infections are not as easily transmitted as other respiratory infection, however we still recommend that you avoid close contact with loved ones, especially the very young and elderly.  Remember to wash your hands thoroughly throughout the day as this is the number one way to prevent the spread of infection!  Home Care: Only take medications as instructed by your medical team. Complete the entire course of an antibiotic. Do not take these medications with alcohol. A steam or ultrasonic humidifier can help congestion.  You can place a towel over your head and breathe in the steam from hot water coming from a faucet. Avoid close contacts especially the very young and the elderly. Cover your mouth when you cough or sneeze. Always remember to wash your hands.  Get Help Right Away If: You develop worsening fever or sinus pain. You develop a severe head ache or visual changes. Your symptoms persist after you have completed your treatment plan.  Make sure you Understand these instructions. Will watch your condition. Will get help right away if you are not doing well or get worse.  Thank you for choosing an e-visit.  Your e-visit answers were reviewed by a board certified advanced clinical practitioner to complete your personal care plan. Depending upon the condition, your plan could have included both over the counter or prescription medications.  Please review your pharmacy choice. Make sure the pharmacy is open so you can pick up prescription now. If there is a problem, you may contact your provider through and have the prescription routed to another pharmacy.  Your safety is important to [employer]. If you have drug allergies check your prescription carefully.   For the next 24 hours you  can use MyChart to ask questions about today's visit, request a non-urgent call back, or ask for a work or school excuse. You will get an email in the next two days asking about your experience. I hope that your e-visit has been valuable and will speed your recovery.   We are sorry that you are not feeling well.  Here is how we plan to help!

## 2021-01-30 DIAGNOSIS — H52223 Regular astigmatism, bilateral: Secondary | ICD-10-CM | POA: Diagnosis not present

## 2021-01-31 ENCOUNTER — Encounter: Payer: Self-pay | Admitting: Family

## 2021-01-31 ENCOUNTER — Other Ambulatory Visit (HOSPITAL_COMMUNITY): Payer: Self-pay

## 2021-01-31 ENCOUNTER — Telehealth: Payer: 59 | Admitting: Family

## 2021-01-31 ENCOUNTER — Telehealth: Payer: 59 | Admitting: Physician Assistant

## 2021-01-31 DIAGNOSIS — J4541 Moderate persistent asthma with (acute) exacerbation: Secondary | ICD-10-CM

## 2021-01-31 DIAGNOSIS — J329 Chronic sinusitis, unspecified: Secondary | ICD-10-CM

## 2021-01-31 MED ORDER — BENZONATATE 200 MG PO CAPS
200.0000 mg | ORAL_CAPSULE | Freq: Three times a day (TID) | ORAL | 0 refills | Status: DC | PRN
  Filled 2021-01-31: qty 60, 20d supply, fill #0

## 2021-01-31 MED ORDER — PROMETHAZINE-DM 6.25-15 MG/5ML PO SYRP
5.0000 mL | ORAL_SOLUTION | Freq: Three times a day (TID) | ORAL | 0 refills | Status: DC | PRN
  Filled 2021-01-31: qty 118, 8d supply, fill #0

## 2021-01-31 NOTE — Progress Notes (Signed)
Virtual Visit Consent   Theresa Carlson, you are scheduled for a virtual visit with a [facility] provider today.     Just as with appointments in the office, your consent must be obtained to participate.  Your consent will be active for this visit and any virtual visit you may have with one of our providers in the next 365 days.     If you have a MyChart account, a copy of this consent can be sent to you electronically.  All virtual visits are billed to your insurance company just like a traditional visit in the office.    As this is a virtual visit, video technology does not allow for your provider to perform a traditional examination.  This may limit your provider's ability to fully assess your condition.  If your provider identifies any concerns that need to be evaluated in person or the need to arrange testing (such as labs, EKG, etc.), we will make arrangements to do so.     Although advances in technology are sophisticated, we cannot ensure that it will always work on either your end or our end.  If the connection with a video visit is poor, the visit may have to be switched to a telephone visit.  With either a video or telephone visit, we are not always able to ensure that we have a secure connection.     I need to obtain your verbal consent now.   Are you willing to proceed with your visit today?    Theresa Carlson has provided verbal consent on 01/31/2021 for a virtual visit (video or telephone).   Theresa Rodney, FNP   Date: 01/31/2021 5:47 PM   Virtual Visit via Video Note   I, Theresa Carlson, connected with  Theresa Carlson  ([id number], 12/25/1990) on 01/31/21 at  5:45 PM EDT by a video-enabled telemedicine application and verified that I am speaking with the correct person using two identifiers.  Location: Patient: Virtual Visit Location Patient: Home Provider: Virtual Visit Location Provider: Home   I discussed the limitations of evaluation and management by telemedicine and the  availability of in person appointments. The patient expressed understanding and agreed to proceed.    History of Present Illness: Theresa Carlson is a 30 y.o. who identifies as a female who was assigned female at birth, and is being seen today for cough and worsening cough that started 01/21/21. She reports she did an Evisit on 01/27/21 and started on Augmentin, prednisone, and tessalon.   HPI: Cough This is a new problem. The current episode started 1 to 4 weeks ago. The problem has been waxing and waning. The problem occurs every few minutes. The cough is Productive of brown sputum. Associated symptoms include ear congestion, ear pain, headaches, nasal congestion, a sore throat and shortness of breath ("when coughing"). Pertinent negatives include no chills, fever, myalgias or wheezing. She has tried rest and prescription cough suppressant (prednisone, augmentin) for the symptoms. The treatment provided mild relief.   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   Avulsion fracture of tibial tuberosity 02/13/2018   Knee pain, right 01/21/2018  Well adult exam 05/07/2016   Acute URI 06/23/2015    Allergies: No Known Allergies Medications:  Current Outpatient Medications:    benzonatate (TESSALON) 200 MG capsule, Take 1 capsule (200 mg total) by mouth 3 (three) times daily as needed for cough., Disp: 60 capsule, Rfl: 0   promethazine-dextromethorphan (PROMETHAZINE-DM) 6.25-15 MG/5ML syrup, Take 5 mLs by mouth 3 (three) times daily as needed for cough., Disp: 118 mL, Rfl: 0   amoxicillin-clavulanate (AUGMENTIN) 875-125 MG tablet,  Take 1 tablet by mouth 2 (two) times daily., Disp: 14 tablet, Rfl: 0   Budeson-Glycopyrrol-Formoterol (BREZTRI AEROSPHERE) 160-9-4.8 MCG/ACT AERO, Inhale 1 puff into the lungs once a week., Disp: , Rfl:    cetirizine (ZYRTEC) 10 MG tablet, Take 1 tablet (10 mg total) by mouth daily., Disp: 30 tablet, Rfl: 5   famotidine (PEPCID) 20 MG tablet, Take 1 tablet (20 mg total) by mouth 2 (two) times daily as needed for heartburn or indigestion., Disp: 60 tablet, Rfl: 5   fluticasone (FLONASE) 50 MCG/ACT nasal spray, Place 1 spray into both nostrils daily as needed for allergies or rhinitis., Disp: 16 g, Rfl: 5   fluticasone-salmeterol (ADVAIR DISKUS) 250-50 MCG/ACT AEPB, Inhale 1 puff into the lungs in the morning and at bedtime., Disp: 60 each, Rfl: 5   MILI 0.25-35 MG-MCG tablet, Take 1 tablet by mouth at bedtime., Disp: , Rfl:    predniSONE (DELTASONE) 10 MG tablet, Take 6 pills today. Decrease by one pill daily until finished, Disp: 21 tablet, Rfl: 0  Observations/Objective: Patient is well-developed, well-nourished in no acute distress.  Resting comfortably  at home.  Head is normocephalic, atraumatic.  No labored breathing.  Speech is clear and coherent with logical content.  Patient is alert and oriented at baseline.  Intermittent coarse cough  Assessment and Plan: 1. Moderate persistent asthma with acute exacerbation - benzonatate (TESSALON) 200 MG capsule; Take 1 capsule (200 mg total) by mouth 3 (three) times daily as needed for cough.  Dispense: 60 capsule; Refill: 0 - promethazine-dextromethorphan (PROMETHAZINE-DM) 6.25-15 MG/5ML syrup; Take 5 mLs by mouth 3 (three) times daily as needed for cough.  Dispense: 118 mL; Refill: 0 Continue Augmentin and prednisone  Force fluids - Take meds as prescribed - Use a cool mist humidifier  -Use saline nose sprays frequently -Force fluids -For any cough or congestion  Use plain Mucinex- regular strength or max strength is fine -For fever or  aces or pains- take tylenol or ibuprofen. -Throat lozenges if help -New toothbrush in 3 day  Follow Up Instructions: I discussed the assessment and treatment plan with the patient. The patient was provided an opportunity to ask questions and all were answered. The patient agreed with the plan and demonstrated an understanding of the instructions.  A copy of instructions were sent to the patient via MyChart.  The patient was advised to call back or seek an in-person evaluation if the symptoms worsen or if the condition fails to improve as anticipated.  Time:  I spent 12 minutes with the patient via telehealth technology discussing the above problems/concerns.    Theresa Rodney, FNP

## 2021-01-31 NOTE — Progress Notes (Signed)
Based on what you shared with me, I feel your condition warrants further evaluation and I recommend that you be seen in a face to face visit. Since symptoms are not improving/worsening despite initial treatment via e-visit you will need to either follow-up with your primary care provider (via video or in office) or be seen at a local urgent care.    NOTE: There will be NO CHARGE for this eVisit   If you are having a true medical emergency please call 911.      For an urgent face to face visit, [place] has six urgent care centers for your convenience:     [facility] Health Urgent Care Center at [facility] Directions [phone] [address] [place], [place] [address]    [facility] Health Urgent Care Center [facility]) Get Driving Directions [phone] [address] [place], [place] [address]  [facility] Health Urgent Care Center [facility]) Get Driving Directions [phone] [address] [place],  [place]  [address]  [facility] Health Urgent Care at [facility] Get Driving Directions [phone] [address] [place], [place] [address]   [facility] Health Urgent Care at [facility] Get Driving Directions  [phone] [address] [place], [place] [address]   [facility] Health Urgent Care at [facility] Directions [phone] [address] [place], [place] [address]  Your MyChart E-visit questionnaire answers were reviewed by a board certified advanced clinical practitioner to complete your personal care plan based on your specific symptoms.  Thank you for using e-Visits.

## 2021-02-01 ENCOUNTER — Other Ambulatory Visit (HOSPITAL_COMMUNITY): Payer: Self-pay

## 2021-04-17 ENCOUNTER — Other Ambulatory Visit (HOSPITAL_COMMUNITY): Payer: Self-pay

## 2021-04-17 ENCOUNTER — Telehealth: Payer: 59 | Admitting: Physician Assistant

## 2021-04-17 DIAGNOSIS — J351 Hypertrophy of tonsils: Secondary | ICD-10-CM

## 2021-04-17 DIAGNOSIS — J029 Acute pharyngitis, unspecified: Secondary | ICD-10-CM

## 2021-04-17 DIAGNOSIS — J069 Acute upper respiratory infection, unspecified: Secondary | ICD-10-CM

## 2021-04-17 MED ORDER — BENZONATATE 100 MG PO CAPS
100.0000 mg | ORAL_CAPSULE | Freq: Three times a day (TID) | ORAL | 0 refills | Status: AC
  Filled 2021-04-17: qty 15, 5d supply, fill #0

## 2021-04-17 MED ORDER — FLUTICASONE PROPIONATE 50 MCG/ACT NA SUSP
2.0000 | Freq: Every day | NASAL | 0 refills | Status: DC
  Filled 2021-04-17: qty 16, 30d supply, fill #0

## 2021-04-17 MED ORDER — AMOXICILLIN-POT CLAVULANATE 875-125 MG PO TABS
1.0000 | ORAL_TABLET | Freq: Two times a day (BID) | ORAL | 0 refills | Status: AC
  Filled 2021-04-17: qty 14, 7d supply, fill #0

## 2021-04-17 NOTE — Progress Notes (Signed)
I have spent 5 minutes in review of e-visit questionnaire, review and updating patient chart, medical decision making and response to patient.   Gildardo Tickner Cody Copeland Lapier, PA-C    

## 2021-04-17 NOTE — Progress Notes (Signed)
Ms. Theresa Carlson, Theresa Carlson are scheduled for a virtual visit with your provider today.    Just as we do with appointments in the office, we must obtain your consent to participate.  Your consent will be active for this visit and any virtual visit you may have with one of our providers in the next 365 days.    If you have a MyChart account, I can also send a copy of this consent to you electronically.  All virtual visits are billed to your insurance company just like a traditional visit in the office.  As this is a virtual visit, video technology does not allow for your provider to perform a traditional examination.  This may limit your provider's ability to fully assess your condition.  If your provider identifies any concerns that need to be evaluated in person or the need to arrange testing such as labs, EKG, etc, we will make arrangements to do so.    Although advances in technology are sophisticated, we cannot ensure that it will always work on either your end or our end.  If the connection with a video visit is poor, we may have to switch to a telephone visit.  With either a video or telephone visit, we are not always able to ensure that we have a secure connection.   I need to obtain your verbal consent now.   Are you willing to proceed with your visit today?   Theresa Carlson has provided verbal consent on 04/17/2021 for a virtual visit (video or telephone).   Theresa Meres, PA-C 04/17/2021  8:30 AM   Date:  04/17/2021   ID:  Theresa Carlson, DOB [birth year], MRN [id number]  Patient Location: Home Provider Location: Home Office   Participants: Patient and Provider for Visit and Wrap up  Method of visit: Video  Location of Patient: Home Location of Provider: Home Office Consent was obtain for visit over the video. Services rendered by provider: Visit was performed via video  A video enabled telemedicine application was used and I verified that I am speaking with the correct person using two  identifiers.  PCP:  Theresa Carlson, Theresa Quint, MD   Chief Complaint:  sore throat  History of Present Illness:    Theresa Carlson is a 30 y.o. female with history as stated below. Presents video telehealth for an acute care visit  Pt is presenting for eval of cough, congestion, rhinorrhea, post nasal drip. She further reports tonsillar swelling that is worse on the right. She states that her right tonsil is generally more swollen than the left. States that her tonsills are not touching. She has been able to tolerate po. She reports chills as well, but denies any documented fever.  She took a covid test that was negative.   Past Medical History:  Diagnosis Date   Asthma    as a child   Missed abortion 02/11/2019   S/P dilatation and curettage 02/11/2019   SVD (spontaneous vaginal delivery) 12/17/2019    No outpatient medications have been marked as taking for the 04/17/21 encounter (Appointment) with [facility] PROVIDER.     Allergies:   Patient has no known allergies.   ROS See HPI for history of present illness.  Physical Exam Vitals and nursing note reviewed.  Constitutional:      General: She is not in acute distress.    Appearance: She is well-developed.  HENT:     Head: Normocephalic and atraumatic.     Mouth/Throat:  Mouth: Mucous membranes are moist.     Pharynx: Uvula midline. Posterior oropharyngeal erythema present. No pharyngeal swelling, oropharyngeal exudate or uvula swelling.     Tonsils: No tonsillar exudate or tonsillar abscesses. 2+ on the right. 1+ on the left.     Comments: Normal phonation. No stridor or trismus. Tolerating secretions. Eyes:     Conjunctiva/sclera: Conjunctivae normal.  Pulmonary:     Effort: Pulmonary effort is normal.  Musculoskeletal:        General: Normal range of motion.     Cervical back: Neck supple.  Skin:    General: Skin is warm and dry.  Neurological:     Mental Status: She is alert.              MDM: Pt with uri  sxs. Is c/o asymmetric tonsillar swelling however she reports this is somewhat chronic. Limited physical exam is not suggestive of a PTA though I will cover her with augmentin out of an abundance of caution. She is given additional meds for supportive tx of her symptoms and will f/u if worse  There are no diagnoses linked to this encounter.   Time:   Today, I have spent 11 minutes with the patient with telehealth technology discussing the above problems, reviewing the chart, previous notes, medications and orders.    Tests Ordered: No orders of the defined types were placed in this encounter.   Medication Changes: No orders of the defined types were placed in this encounter.    Disposition:  Follow up  Signed, Theresa Carlson Theresa Shirts, PA-C  04/17/2021 8:30 AM

## 2021-04-17 NOTE — Progress Notes (Signed)
Based on what you shared with me, I feel your condition warrants further evaluation and I recommend that you be seen in a face to face visit.  Symptoms could be related to either a viral pharyngitis or secondary bacterial cause but when there is only one tonsil that is very swollen, especially tot he point to where you are gagging with swallowing, we need to rule out the potential of an abscess behind the tonsil causing these symptoms as this can be more serious. It is recommended you be seen in person so you can get a detailed exam on the mouth, oral cavity and tonsils themselves so you get the proper treatment.    NOTE: There will be NO CHARGE for this eVisit   If you are having a true medical emergency please call 911.      For an urgent face to face visit, [place] has six urgent care centers for your convenience:     [facility] Health Urgent Care Center at [facility] Directions [phone] [address] [place], [place] [address]    [facility] Health Urgent Care Center [facility]) Get Driving Directions [phone] [address] [place], [place] [address]  [facility] Urgent Care Center [facility]) Get Driving Directions [phone] [address] [place],  [place]  [address]  [facility] Health Urgent Care at [facility] Get Driving Directions [phone] [address] [place], [place] [address]   [facility] Health Urgent Care at [facility] Get Driving Directions  [phone] [address] [place], [place] [address]   [facility] Health Urgent Care at [facility] Directions [phone] [address] [place], [place] [address]  Your MyChart E-visit questionnaire answers were reviewed by a board certified advanced clinical practitioner to complete your personal care plan based on your specific symptoms.  Thank you for using e-Visits.

## 2021-04-17 NOTE — Patient Instructions (Signed)
  Theresa Carlson, thank you for joining Karrie Meres, PA-C for today's virtual visit.  While this provider is not your primary care provider (PCP), if your PCP is located in our provider database this encounter information will be shared with them immediately following your visit.  Consent: (Patient) Theresa Carlson provided verbal consent for this virtual visit at the beginning of the encounter.  Current Medications:  Current Outpatient Medications:    amoxicillin-clavulanate (AUGMENTIN) 875-125 MG tablet, Take 1 tablet by mouth 2 (two) times daily., Disp: 14 tablet, Rfl: 0   benzonatate (TESSALON) 200 MG capsule, Take 1 capsule (200 mg total) by mouth 3 (three) times daily as needed for cough., Disp: 60 capsule, Rfl: 0   Budeson-Glycopyrrol-Formoterol (BREZTRI AEROSPHERE) 160-9-4.8 MCG/ACT AERO, Inhale 1 puff into the lungs once a week., Disp: , Rfl:    cetirizine (ZYRTEC) 10 MG tablet, Take 1 tablet (10 mg total) by mouth daily., Disp: 30 tablet, Rfl: 5   famotidine (PEPCID) 20 MG tablet, Take 1 tablet (20 mg total) by mouth 2 (two) times daily as needed for heartburn or indigestion., Disp: 60 tablet, Rfl: 5   fluticasone (FLONASE) 50 MCG/ACT nasal spray, Place 1 spray into both nostrils daily as needed for allergies or rhinitis., Disp: 16 g, Rfl: 5   fluticasone-salmeterol (ADVAIR DISKUS) 250-50 MCG/ACT AEPB, Inhale 1 puff into the lungs in the morning and at bedtime., Disp: 60 each, Rfl: 5   MILI 0.25-35 MG-MCG tablet, Take 1 tablet by mouth at bedtime., Disp: , Rfl:    predniSONE (DELTASONE) 10 MG tablet, Take 6 pills today. Decrease by one pill daily until finished, Disp: 21 tablet, Rfl: 0   promethazine-dextromethorphan (PROMETHAZINE-DM) 6.25-15 MG/5ML syrup, Take 5 mLs by mouth 3 (three) times daily as needed for cough., Disp: 118 mL, Rfl: 0   Medications ordered in this encounter:  No orders of the defined types were placed in this encounter.    *If you need refills on other  medications prior to your next appointment, please contact your pharmacy*  Follow-Up: Call back or seek an in-person evaluation if the symptoms worsen or if the condition fails to improve as anticipated.  Other Instructions Take medication as directed   Please follow up with your primary care provider within 5-7 days for re-evaluation of your symptoms.  Please follow up in person for any new or worsening symptoms.   If you have been instructed to have an in-person evaluation today at a local Urgent Care facility, please use the link below. It will take you to a list of all of our available [place] [facility], including address, phone number and hours of operation. Please do not delay care.  [place] [facility]  If you or a family member do not have a primary care provider, use the link below to schedule a visit and establish care. When you choose a [facility] primary care physician or advanced practice provider, you gain a long-term partner in health. Find a Primary Care Provider  Learn more about [facility] in-office and virtual care options: [facility] - Get Care Now

## 2021-04-29 ENCOUNTER — Encounter (HOSPITAL_COMMUNITY): Payer: Self-pay | Admitting: Emergency Medicine

## 2021-04-29 ENCOUNTER — Ambulatory Visit (HOSPITAL_COMMUNITY)
Admission: EM | Admit: 2021-04-29 | Discharge: 2021-04-29 | Disposition: A | Discharge status: Home / Self Care | Payer: 59 | Attending: Student | Admitting: Student

## 2021-04-29 ENCOUNTER — Other Ambulatory Visit: Payer: Self-pay

## 2021-04-29 DIAGNOSIS — J0100 Acute maxillary sinusitis, unspecified: Secondary | ICD-10-CM

## 2021-04-29 DIAGNOSIS — J4541 Moderate persistent asthma with (acute) exacerbation: Secondary | ICD-10-CM

## 2021-04-29 MED ORDER — DOXYCYCLINE HYCLATE 100 MG PO CAPS: 100.0000 mg | ORAL_CAPSULE | Freq: Two times a day (BID) | ORAL | 0 refills | Status: AC

## 2021-04-29 MED ORDER — PREDNISONE 20 MG PO TABS: 40.0000 mg | ORAL_TABLET | Freq: Every day | ORAL | 0 refills | Status: AC

## 2021-04-29 MED ORDER — FLUCONAZOLE 150 MG PO TABS: 150.0000 mg | ORAL_TABLET | Freq: Every day | ORAL | 0 refills | Status: DC

## 2021-04-29 NOTE — Discharge Instructions (Addendum)
-  Prednisone, 2 pills taken at the same time for 5 days in a row.  Try taking this earlier in the day as it can give you energy. Avoid NSAIDs like ibuprofen and alleve while taking this medication as they can increase your risk of stomach upset and even GI bleeding when in combination with a steroid. You can continue tylenol (acetaminophen) up to 1000mg  3x daily. -Doxycycline twice daily for 7 days.  Make sure to wear sunscreen while spending time outside while on this medication as it can increase your chance of sunburn. You can take this medication with food if you have a sensitive stomach. -For your yeast infection, start the Diflucan (fluconazole)- Take one pill today (day 1). If you're still having symptoms in 3 days, take the second pill.  -Continue inhalers at home: , albuterol

## 2021-04-29 NOTE — ED Provider Notes (Signed)
[facility]    CSN: [id number] Arrival date & time: 04/29/21  1005      History   Chief Complaint Chief Complaint  Patient presents with   Cough    HPI Theresa Carlson is a 30 y.o. female presenting with viral syndrome for about 2 weeks.  Medical history asthmatic bronchitis.  States that she was initially sick on 10/17, had a televisit the next day and was prescribed Augmentin, Flonase which she has completed with minimal improvement.  Has also been using her Breztri inhaler at home.  Describes cough productive of yellow sputum, worsening maxillary sinus tenderness with purulent yellow drainage, throbbing headaches behind eyes, sensitivity to light.  COVID-negative.  HPI  Past Medical History:  Diagnosis Date   Asthma    as a child   Missed abortion 02/11/2019   S/P dilatation and curettage 02/11/2019   SVD (spontaneous vaginal delivery) 12/17/2019    Patient Active Problem List   Diagnosis Date Noted   Asthmatic bronchitis 11/04/2020   Normal pregnancy in third trimester 12/17/2019   SVD (spontaneous vaginal delivery) 12/17/2019   Tachycardia 09/01/2019   Missed abortion 02/11/2019   S/P dilatation and curettage 02/11/2019   Dyslipidemia 08/13/2018   Family history of diabetes mellitus 08/13/2018   Abscess 04/02/2018   Patella alta 02/13/2018   Avulsion fracture of tibial tuberosity 02/13/2018   Knee pain, right 01/21/2018   Well adult exam 05/07/2016   Acute URI 06/23/2015    Past Surgical History:  Procedure Laterality Date   DILATION AND EVACUATION N/A 02/11/2019   Procedure: DILATATION AND EVACUATION;  Surgeon: Sherian Rein, MD;  Location: [facility] OR;  Service: Gynecology;  Laterality: N/A;   trigger thumb      OB History     Gravida  2   Para  1   Term  1   Preterm      AB  1   Living  1      SAB  1   IAB      Ectopic      Multiple  0   Live Births  1            Home Medications    Prior to Admission medications    Medication Sig Start Date End Date Taking? Authorizing Provider  doxycycline (VIBRAMYCIN) 100 MG capsule Take 1 capsule (100 mg total) by mouth 2 (two) times daily for 7 days. 04/29/21 05/06/21 Yes Rhys Martini, PA-C  fluconazole (DIFLUCAN) 150 MG tablet Take 1 tablet (150 mg total) by mouth daily. -For your yeast infection, start the Diflucan (fluconazole)- Take one pill today (day 1). If you're still having symptoms in 3 days, take the second pill. 04/29/21  Yes Rhys Martini, PA-C  predniSONE (DELTASONE) 20 MG tablet Take 2 tablets (40 mg total) by mouth daily for 5 days. Take with breakfast or lunch. Avoid NSAIDs (ibuprofen, etc) while taking this medication. 04/29/21 05/04/21 Yes Rhys Martini, PA-C  benzonatate (TESSALON) 200 MG capsule Take 1 capsule (200 mg total) by mouth 3 (three) times daily as needed for cough. 01/31/21   [name] A, FNP  Budeson-Glycopyrrol-Formoterol (BREZTRI AEROSPHERE) 160-9-4.8 MCG/ACT AERO Inhale 1 puff into the lungs once a week.    [provider]  cetirizine (ZYRTEC) 10 MG tablet Take 1 tablet (10 mg total) by mouth daily. 01/05/21   Hetty Blend, FNP  famotidine (PEPCID) 20 MG tablet Take 1 tablet (20 mg total) by mouth 2 (two) times  daily as needed for heartburn or indigestion. 01/05/21   Hetty Blend, FNP  fluticasone (FLONASE) 50 MCG/ACT nasal spray Place 1 spray into both nostrils daily as needed for allergies or rhinitis. 01/05/21   Hetty Blend, FNP  fluticasone (FLONASE) 50 MCG/ACT nasal spray Place 2 sprays into both nostrils daily. 04/17/21   Couture, Cortni S, PA-C  fluticasone-salmeterol (ADVAIR DISKUS) 250-50 MCG/ACT AEPB Inhale 1 puff into the lungs in the morning and at bedtime. 01/05/21   Hetty Blend, FNP  MILI 0.25-35 MG-MCG tablet Take 1 tablet by mouth at bedtime. 09/12/20   [provider]  promethazine-dextromethorphan (PROMETHAZINE-DM) 6.25-15 MG/5ML syrup Take 5 mLs by mouth 3 (three) times daily as needed for cough.  01/31/21   Junie Spencer, FNP    Family History Family History  Problem Relation Age of Onset   Hypertension Mother    Eczema Mother    Hypertension Father    Stroke Maternal Grandfather    Prostate cancer Maternal Grandfather    Diabetes Maternal Grandfather    Breast cancer Paternal Grandmother    Lung cancer Paternal Grandfather     Social History Social History   Tobacco Use   Smoking status: Never   Smokeless tobacco: Never  Vaping Use   Vaping Use: Never used  Substance Use Topics   Alcohol use: Not Currently    Alcohol/week: 1.0 standard drink    Types: 1 Glasses of wine per week    Comment: occasonally    Drug use: No     Allergies   Patient has no known allergies.   Review of Systems Review of Systems  Constitutional:  Negative for appetite change, chills and fever.  HENT:  Positive for congestion and sinus pressure. Negative for ear pain, rhinorrhea, sinus pain and sore throat.   Eyes:  Negative for redness and visual disturbance.  Respiratory:  Positive for cough. Negative for chest tightness, shortness of breath and wheezing.   Cardiovascular:  Negative for chest pain and palpitations.  Gastrointestinal:  Negative for abdominal pain, constipation, diarrhea, nausea and vomiting.  Genitourinary:  Negative for dysuria, frequency and urgency.  Musculoskeletal:  Negative for myalgias.  Neurological:  Negative for dizziness, weakness and headaches.  Psychiatric/Behavioral:  Negative for confusion.   All other systems reviewed and are negative.   Physical Exam Triage Vital Signs ED Triage Vitals  Enc Vitals Group     BP 04/29/21 1041 117/71     Pulse Rate 04/29/21 1041 (!) 106     Resp --      Temp 04/29/21 1041 98.8 F (37.1 C)     Temp Source 04/29/21 1041 Oral     SpO2 04/29/21 1041 100 %     Weight --      Height --      Head Circumference --      Peak Flow --      Pain Score 04/29/21 1046 8     Pain Loc --      Pain Edu? --      Excl.  in GC? --    No data found.  Updated Vital Signs BP 117/71 (BP Location: Left Arm)   Pulse (!) 106   Temp 98.8 F (37.1 C) (Oral)   LMP 04/15/2021   SpO2 100%   Visual Acuity Right Eye Distance:   Left Eye Distance:   Bilateral Distance:    Right Eye Near:   Left Eye Near:    Bilateral Near:  Physical Exam Vitals reviewed.  Constitutional:      General: She is not in acute distress.    Appearance: Normal appearance. She is not ill-appearing.  HENT:     Head: Normocephalic and atraumatic.     Right Ear: Tympanic membrane, ear canal and external ear normal. No tenderness. No middle ear effusion. There is no impacted cerumen. Tympanic membrane is not perforated, erythematous, retracted or bulging.     Left Ear: Tympanic membrane, ear canal and external ear normal. No tenderness.  No middle ear effusion. There is no impacted cerumen. Tympanic membrane is not perforated, erythematous, retracted or bulging.     Nose: No congestion.     Right Sinus: Maxillary sinus tenderness present.     Left Sinus: Maxillary sinus tenderness present.     Mouth/Throat:     Mouth: Mucous membranes are moist.     Pharynx: Uvula midline. No oropharyngeal exudate or posterior oropharyngeal erythema.  Eyes:     Extraocular Movements: Extraocular movements intact.     Pupils: Pupils are equal, round, and reactive to light.  Cardiovascular:     Rate and Rhythm: Regular rhythm. Tachycardia present.     Heart sounds: Normal heart sounds.  Pulmonary:     Effort: Pulmonary effort is normal.     Breath sounds: Normal breath sounds. No decreased breath sounds, wheezing, rhonchi or rales.     Comments: Frequent cough  Abdominal:     Palpations: Abdomen is soft.     Tenderness: There is no abdominal tenderness. There is no guarding or rebound.  Neurological:     General: No focal deficit present.     Mental Status: She is alert and oriented to person, place, and time.  Psychiatric:        Mood and  Affect: Mood normal.        Behavior: Behavior normal.        Thought Content: Thought content normal.        Judgment: Judgment normal.     UC Treatments / Results  Labs (all labs ordered are listed, but only abnormal results are displayed) Labs Reviewed - No data to display  EKG   Radiology No results found.  Procedures Procedures (including critical care time)  Medications Ordered in UC Medications - No data to display  Initial Impression / Assessment and Plan / UC Course  I have reviewed the triage vital signs and the nursing notes.  Pertinent labs & imaging results that were available during my care of the patient were reviewed by me and considered in my medical decision making (see chart for details).     This patient is a very pleasant 30 y.o. year old female presenting with asthma exacerbation and maxillary sinusitis.  Afebrile but tachycardic, oxygenating well on room air.  History asthma and bronchitis.  Has already completed Augmentin during this current illness, and tested negative for COVID.  We will treat sinusitis with doxycycline, states she is not pregnant or breast-feeding.  Also sent low-dose prednisone, and Diflucan for yeast infection prophylaxis. ED return precautions discussed. Patient verbalizes understanding and agreement. Coding Level 4 for acute exacerbation of chronic condition, and prescription drug management .   Final Clinical Impressions(s) / UC Diagnoses   Final diagnoses:  Acute non-recurrent maxillary sinusitis  Moderate persistent asthma with acute exacerbation     Discharge Instructions      -Prednisone, 2 pills taken at the same time for 5 days in a row.  Try taking this  earlier in the day as it can give you energy. Avoid NSAIDs like ibuprofen and alleve while taking this medication as they can increase your risk of stomach upset and even GI bleeding when in combination with a steroid. You can continue tylenol (acetaminophen) up to  1000mg  3x daily. -Doxycycline twice daily for 7 days.  Make sure to wear sunscreen while spending time outside while on this medication as it can increase your chance of sunburn. You can take this medication with food if you have a sensitive stomach. -For your yeast infection, start the Diflucan (fluconazole)- Take one pill today (day 1). If you're still having symptoms in 3 days, take the second pill.  -Continue inhalers at home: , albuterol   ED Prescriptions     Medication Sig Dispense Auth. Provider   doxycycline (VIBRAMYCIN) 100 MG capsule Take 1 capsule (100 mg total) by mouth 2 (two) times daily for 7 days. 14 capsule Markus Daft, PA-C   predniSONE (DELTASONE) 20 MG tablet Take 2 tablets (40 mg total) by mouth daily for 5 days. Take with breakfast or lunch. Avoid NSAIDs (ibuprofen, etc) while taking this medication. 10 tablet Rhys Martini, PA-C   fluconazole (DIFLUCAN) 150 MG tablet Take 1 tablet (150 mg total) by mouth daily. -For your yeast infection, start the Diflucan (fluconazole)- Take one pill today (day 1). If you're still having symptoms in 3 days, take the second pill. 2 tablet Rhys Martini, PA-C      PDMP not reviewed this encounter.   Rhys Martini, PA-C 04/29/21 1114

## 2021-04-29 NOTE — ED Triage Notes (Signed)
Pt having cough that is not getting any better and headache that is not relieved with her medications.

## 2021-06-08 ENCOUNTER — Other Ambulatory Visit (HOSPITAL_COMMUNITY): Payer: Self-pay

## 2021-06-08 ENCOUNTER — Ambulatory Visit: Payer: 59 | Admitting: Adult Health

## 2021-06-08 ENCOUNTER — Encounter: Payer: Self-pay | Admitting: Adult Health

## 2021-06-08 VITALS — BP 118/78 | HR 72 | Temp 97.1°F | Ht 65.0 in | Wt 135.0 lb

## 2021-06-08 DIAGNOSIS — J329 Chronic sinusitis, unspecified: Secondary | ICD-10-CM | POA: Diagnosis not present

## 2021-06-08 LAB — CBC WITH DIFFERENTIAL/PLATELET
Basophils Absolute: 0 10*3/uL (ref 0.0–0.1)
Basophils Relative: 0.7 % (ref 0.0–3.0)
Eosinophils Absolute: 0.4 10*3/uL (ref 0.0–0.7)
Eosinophils Relative: 6.6 % — ABNORMAL HIGH (ref 0.0–5.0)
HCT: 40.8 % (ref 36.0–46.0)
Hemoglobin: 14 g/dL (ref 12.0–15.0)
Lymphocytes Relative: 26.9 % (ref 12.0–46.0)
Lymphs Abs: 1.7 10*3/uL (ref 0.7–4.0)
MCHC: 34.4 g/dL (ref 30.0–36.0)
MCV: 86.4 fl (ref 78.0–100.0)
Monocytes Absolute: 0.5 10*3/uL (ref 0.1–1.0)
Monocytes Relative: 8.2 % (ref 3.0–12.0)
Neutro Abs: 3.7 10*3/uL (ref 1.4–7.7)
Neutrophils Relative %: 57.6 % (ref 43.0–77.0)
Platelets: 262 10*3/uL (ref 150.0–400.0)
RBC: 4.72 Mil/uL (ref 3.87–5.11)
RDW: 12.9 % (ref 11.5–15.5)
WBC: 6.4 10*3/uL (ref 4.0–10.5)

## 2021-06-08 LAB — SEDIMENTATION RATE: Sed Rate: 32 mm/hr — ABNORMAL HIGH (ref 0–20)

## 2021-06-08 MED ORDER — AZELASTINE HCL 0.1 % NA SOLN
2.0000 | Freq: Two times a day (BID) | NASAL | 2 refills | Status: DC
  Filled 2021-06-08: qty 30, 25d supply, fill #0

## 2021-06-08 NOTE — Progress Notes (Signed)
Subjective:    Patient ID: Theresa Carlson, female    DOB: January 03, 1991, 30 y.o.   MRN: [id number]  HPI 30 year old female who  has a past medical history of Asthma, Missed abortion (02/11/2019), S/P dilatation and curettage (02/11/2019), and SVD (spontaneous vaginal delivery) (12/17/2019).  She is a patient of Dr. Posey Rea who I am seeing today for recurrent sinusitis.  She has had multiple sinus infections for the last 6 months.  Has had multiple rounds of antibiotics, sinus infections seem to be returning at about a 3-week interval.  Currently has had symptoms of postnasal drip, sinus congestion, and cough over the last week's the symptoms seem to be improving.   Review of Systems See HPI   Past Medical History:  Diagnosis Date   Asthma    as a child   Missed abortion 02/11/2019   S/P dilatation and curettage 02/11/2019   SVD (spontaneous vaginal delivery) 12/17/2019    Social History   Socioeconomic History   Marital status: Married    Spouse name: Not on file   Number of children: Not on file   Years of education: Not on file   Highest education level: Not on file  Occupational History   Occupation: Charity fundraiser    Employer: [employer]  Tobacco Use   Smoking status: Never   Smokeless tobacco: Never  Vaping Use   Vaping Use: Never used  Substance and Sexual Activity   Alcohol use: Not Currently    Alcohol/week: 1.0 standard drink    Types: 1 Glasses of wine per week    Comment: occasonally    Drug use: No   Sexual activity: Yes  Other Topics Concern   Not on file  Social History Narrative   Not on file   Social Determinants of Health   Financial Resource Strain: Not on file  Food Insecurity: Not on file  Transportation Needs: Not on file  Physical Activity: Not on file  Stress: Not on file  Social Connections: Not on file  Intimate Partner Violence: Not on file    Past Surgical History:  Procedure Laterality Date   DILATION AND EVACUATION N/A 02/11/2019   Procedure:  DILATATION AND EVACUATION;  Surgeon: Sherian Rein, MD;  Location: [facility];  Service: Gynecology;  Laterality: N/A;   trigger thumb      Family History  Problem Relation Age of Onset   Hypertension Mother    Eczema Mother    Hypertension Father    Stroke Maternal Grandfather    Prostate cancer Maternal Grandfather    Diabetes Maternal Grandfather    Breast cancer Paternal Grandmother    Lung cancer Paternal Grandfather     No Known Allergies  Current Outpatient Medications on File Prior to Visit  Medication Sig Dispense Refill   Budeson-Glycopyrrol-Formoterol (BREZTRI AEROSPHERE) 160-9-4.8 MCG/ACT AERO Inhale 1 puff into the lungs once a week.     fluconazole (DIFLUCAN) 150 MG tablet Take 1 tablet (150 mg total) by mouth daily. -For your yeast infection, start the Diflucan (fluconazole)- Take one pill today (day 1). If you're still having symptoms in 3 days, take the second pill. 2 tablet 0   fluticasone (FLONASE) 50 MCG/ACT nasal spray Place 2 sprays into both nostrils daily. 16 g 0   fluticasone-salmeterol (ADVAIR DISKUS) 250-50 MCG/ACT AEPB Inhale 1 puff into the lungs in the morning and at bedtime. 60 each 5   MILI 0.25-35 MG-MCG tablet Take 1 tablet by mouth at bedtime.  No current facility-administered medications on file prior to visit.    BP 118/78   Pulse 72   Temp (!) 97.1 F (36.2 C) (Oral)   Ht 5\' 5"  (1.651 m)   Wt 135 lb (61.2 kg)   SpO2 98%   BMI 22.47 kg/m       Objective:   Physical Exam Vitals and nursing note reviewed.  Constitutional:      Appearance: Normal appearance.  HENT:     Nose: Congestion and rhinorrhea present.  Cardiovascular:     Rate and Rhythm: Normal rate and regular rhythm.     Pulses: Normal pulses.     Heart sounds: Normal heart sounds.  Pulmonary:     Effort: Pulmonary effort is normal.     Breath sounds: Normal breath sounds.  Skin:    General: Skin is warm and dry.     Capillary Refill: Capillary refill takes  less than 2 seconds.  Neurological:     General: No focal deficit present.     Mental Status: She is alert and oriented to person, place, and time.  Psychiatric:        Mood and Affect: Mood normal.        Behavior: Behavior normal.        Thought Content: Thought content normal.      Assessment & Plan:  1. Recurrent sinusitis - Due to recurrent sinus infections will order CT of face and send to ENT for further evaluation.  - CBC with Differential/Platelet - Sedimentation Rate - CT Maxillofacial WO CM; Future - azelastine (ASTELIN) 0.1 % nasal spray; Place 2 sprays into both nostrils 2 (two) times daily. Use in each nostril as directed  Dispense: 30 mL; Refill: 2 - Ambulatory referral to ENT  , NP

## 2021-06-08 NOTE — Patient Instructions (Signed)
It was great seeing you today   I am going to check some blood work today   I have referred you to ENT and for a CT of the sinuses   Sent in Astelin nasal spray

## 2021-07-02 DIAGNOSIS — Z1389 Encounter for screening for other disorder: Secondary | ICD-10-CM | POA: Diagnosis not present

## 2021-07-02 DIAGNOSIS — Z01419 Encounter for gynecological examination (general) (routine) without abnormal findings: Secondary | ICD-10-CM | POA: Diagnosis not present

## 2021-07-02 DIAGNOSIS — Z6823 Body mass index (BMI) 23.0-23.9, adult: Secondary | ICD-10-CM | POA: Diagnosis not present

## 2021-07-02 DIAGNOSIS — Z3009 Encounter for other general counseling and advice on contraception: Secondary | ICD-10-CM | POA: Diagnosis not present

## 2021-07-09 ENCOUNTER — Encounter: Payer: Self-pay | Admitting: Adult Health

## 2021-07-09 ENCOUNTER — Ambulatory Visit
Admission: RE | Admit: 2021-07-09 | Discharge: 2021-07-09 | Disposition: A | Discharge status: Home / Self Care | Payer: 59 | Source: Ambulatory Visit | Attending: Adult Health | Admitting: Adult Health

## 2021-07-09 DIAGNOSIS — J329 Chronic sinusitis, unspecified: Secondary | ICD-10-CM

## 2021-07-09 DIAGNOSIS — J342 Deviated nasal septum: Secondary | ICD-10-CM | POA: Diagnosis not present

## 2021-07-09 DIAGNOSIS — J3489 Other specified disorders of nose and nasal sinuses: Secondary | ICD-10-CM | POA: Diagnosis not present

## 2021-07-13 ENCOUNTER — Other Ambulatory Visit: Payer: Self-pay

## 2021-07-13 ENCOUNTER — Other Ambulatory Visit (HOSPITAL_COMMUNITY): Payer: Self-pay

## 2021-07-13 ENCOUNTER — Ambulatory Visit: Payer: 59 | Admitting: Adult Health

## 2021-07-13 ENCOUNTER — Encounter: Payer: Self-pay | Admitting: Adult Health

## 2021-07-13 VITALS — BP 110/72 | HR 86 | Temp 99.0°F | Ht 65.0 in | Wt 135.0 lb

## 2021-07-13 DIAGNOSIS — J349 Unspecified disorder of nose and nasal sinuses: Secondary | ICD-10-CM

## 2021-07-13 MED ORDER — FLUTICASONE PROPIONATE 50 MCG/ACT NA SUSP
2.0000 | Freq: Every day | NASAL | 6 refills | Status: DC
  Filled 2021-07-13: qty 16, 30d supply, fill #0

## 2021-07-13 NOTE — Progress Notes (Signed)
Subjective:    Patient ID: Theresa Carlson, female    DOB: February 07, 1991, 31 y.o.   MRN: [id number]  HPI 31 year old female, patient of Dr. Alain Marion who I last saw roughly 1 month ago for recurrent sinusitis.  She had had multiple sinus infections over the previous 6 months with multiple rounds of antibiotics, sinus infections to returning 3-week intervals.  CT maxillofacial was ordered which showed  IMPRESSION: Paranasal sinus disease within the inferior frontal sinuses/frontoethmoidal recesses, ethmoid sinuses, sphenoid sinuses and maxillary sinuses bilaterally, as outlined. The bilateral frontoethmoidal recesses, sphenoid sinus ostia and right ostiomeatal unit are partially obstructed.   Slight leftward deviation of the nasal septum anteriorly.   Mild mucosal thickening, and possible fluid, within the bilateral nasal passages.  Report that her symptoms have started back over the last week.   Additionally, she would like to establish care with myself as her new PCP.   Review of Systems See HPI   Past Medical History:  Diagnosis Date   Asthma    as a child   Missed abortion 02/11/2019   S/P dilatation and curettage 02/11/2019   SVD (spontaneous vaginal delivery) 12/17/2019    Social History   Socioeconomic History   Marital status: Married    Spouse name: Not on file   Number of children: Not on file   Years of education: Not on file   Highest education level: Master's degree (e.g., MA, MS, MEng, MEd, MSW, MBA)  Occupational History   Occupation: Programmer, multimedia: [employer]  Tobacco Use   Smoking status: Never   Smokeless tobacco: Never  Vaping Use   Vaping Use: Never used  Substance and Sexual Activity   Alcohol use: Not Currently    Alcohol/week: 1.0 standard drink    Types: 1 Glasses of wine per week    Comment: occasonally    Drug use: No   Sexual activity: Yes  Other Topics Concern   Not on file  Social History Narrative   Not on file   Social  Determinants of Health   Financial Resource Strain: Low Risk    Difficulty of Paying Living Expenses: Not hard at all  Food Insecurity: No Food Insecurity   Worried About Charity fundraiser in the Last Year: Never true   [place] in the Last Year: Never true  Transportation Needs: No Transportation Needs   Lack of Transportation (Medical): No   Lack of Transportation (Non-Medical): No  Physical Activity: Insufficiently Active   Days of Exercise per Week: 2 days   Minutes of Exercise per Session: 30 min  Stress: No Stress Concern Present   Feeling of Stress : Not at all  Social Connections: Socially Integrated   Frequency of Communication with Friends and Family: More than three times a week   Frequency of Social Gatherings with Friends and Family: Once a week   Attends Religious Services: More than 4 times per year   Active Member of [employer] or Organizations: Yes   Attends Music therapist: More than 4 times per year   Marital Status: Married  Human resources officer Violence: Not on file    Past Surgical History:  Procedure Laterality Date   DILATION AND EVACUATION N/A 02/11/2019   Procedure: [place];  Surgeon: Janyth Contes, MD;  Location: [place];  Service: Gynecology;  Laterality: N/A;   trigger thumb      Family History  Problem Relation Age of Onset   Hypertension  Mother    Eczema Mother    Hypertension Father    Stroke Maternal Grandfather    Prostate cancer Maternal Grandfather    Diabetes Maternal Grandfather    Breast cancer Paternal Grandmother    Lung cancer Paternal Grandfather     No Known Allergies  Current Outpatient Medications on File Prior to Visit  Medication Sig Dispense Refill   Budeson-Glycopyrrol-Formoterol (BREZTRI AEROSPHERE) 160-9-4.8 MCG/ACT AERO Inhale 1 puff into the lungs once a week.     MILI 0.25-35 MG-MCG tablet Take 1 tablet by mouth at bedtime.     No current facility-administered medications on  file prior to visit.    BP 110/72    Pulse 86    Temp 99 F (37.2 C) (Oral)    Ht 5\' 5"  (1.651 m)    Wt 135 lb (61.2 kg)    SpO2 97%    BMI 22.47 kg/m       Objective:   Physical Exam Vitals and nursing note reviewed.  Constitutional:      Appearance: Normal appearance.  Musculoskeletal:        General: Normal range of motion.  Skin:    General: Skin is warm and dry.     Capillary Refill: Capillary refill takes less than 2 seconds.  Neurological:     General: No focal deficit present.     Mental Status: She is alert and oriented to person, place, and time.  Psychiatric:        Mood and Affect: Mood normal.        Behavior: Behavior normal.        Thought Content: Thought content normal.        Judgment: Judgment normal.       Assessment & Plan:  1. Paranasal sinus disease - Referral to ENT  - fluticasone (FLONASE) 50 MCG/ACT nasal spray; Place 2 sprays into both nostrils daily.  Dispense: 16 g; Refill: 6  Ok to accept patient as NP   Dorothyann Peng, NP

## 2021-08-02 ENCOUNTER — Telehealth: Payer: 59 | Admitting: Family Medicine

## 2021-08-02 DIAGNOSIS — J020 Streptococcal pharyngitis: Secondary | ICD-10-CM

## 2021-08-03 MED ORDER — PENICILLIN V POTASSIUM 500 MG PO TABS: 500.0000 mg | ORAL_TABLET | Freq: Two times a day (BID) | ORAL | 0 refills | Status: AC

## 2021-08-03 NOTE — Progress Notes (Signed)
E-Visit for Sore Throat - Strep Symptoms  We are sorry that you are not feeling well.  Here is how we plan to help!  Based on what you have shared with me it is likely that you have strep pharyngitis.  Strep pharyngitis is inflammation and infection in the back of the throat.  This is an infection cause by bacteria and is treated with antibiotics.  I have prescribed Penicillin V 500 mg twice a day for 10 days. For throat pain, we recommend over the counter oral pain relief medications such as acetaminophen or aspirin, or anti-inflammatory medications such as ibuprofen or naproxen sodium. Topical treatments such as oral throat lozenges or sprays may be used as needed. Strep infections are not as easily transmitted as other respiratory infections, however we still recommend that you avoid close contact with loved ones, especially the very young and elderly.  Remember to wash your hands thoroughly throughout the day as this is the number one way to prevent the spread of infection and wipe down door knobs and counters with disinfectant.   Home Care: Only take medications as instructed by your medical team. Complete the entire course of an antibiotic. Do not take these medications with alcohol. A steam or ultrasonic humidifier can help congestion.  You can place a towel over your head and breathe in the steam from hot water coming from a faucet. Avoid close contacts especially the very young and the elderly. Cover your mouth when you cough or sneeze. Always remember to wash your hands.  Get Help Right Away If: You develop worsening fever or sinus pain. You develop a severe head ache or visual changes. Your symptoms persist after you have completed your treatment plan.  Make sure you Understand these instructions. Will watch your condition. Will get help right away if you are not doing well or get worse.   Thank you for choosing an e-visit.  Your e-visit answers were reviewed by a board  certified advanced clinical practitioner to complete your personal care plan. Depending upon the condition, your plan could have included both over the counter or prescription medications.  Please review your pharmacy choice. Make sure the pharmacy is open so you can pick up prescription now. If there is a problem, you may contact your provider through MyChart messaging and have the prescription routed to another pharmacy.  Your safety is important to us. If you have drug allergies check your prescription carefully.   For the next 24 hours you can use MyChart to ask questions about today's visit, request a non-urgent call back, or ask for a work or school excuse. You will get an email in the next two days asking about your experience. I hope that your e-visit has been valuable and will speed your recovery.  I provided 5 minutes of non face-to-face time during this encounter for chart review, medication and order placement, as well as and documentation.   

## 2021-08-13 ENCOUNTER — Emergency Department (HOSPITAL_BASED_OUTPATIENT_CLINIC_OR_DEPARTMENT_OTHER)
Admission: EM | Admit: 2021-08-13 | Discharge: 2021-08-13 | Disposition: A | Discharge status: Home / Self Care | Payer: 59 | Attending: Emergency Medicine | Admitting: Emergency Medicine

## 2021-08-13 ENCOUNTER — Other Ambulatory Visit: Payer: Self-pay

## 2021-08-13 ENCOUNTER — Encounter (HOSPITAL_BASED_OUTPATIENT_CLINIC_OR_DEPARTMENT_OTHER): Payer: Self-pay

## 2021-08-13 DIAGNOSIS — Z7951 Long term (current) use of inhaled steroids: Secondary | ICD-10-CM | POA: Insufficient documentation

## 2021-08-13 DIAGNOSIS — R002 Palpitations: Secondary | ICD-10-CM | POA: Diagnosis not present

## 2021-08-13 DIAGNOSIS — J45909 Unspecified asthma, uncomplicated: Secondary | ICD-10-CM | POA: Insufficient documentation

## 2021-08-13 DIAGNOSIS — R000 Tachycardia, unspecified: Secondary | ICD-10-CM | POA: Insufficient documentation

## 2021-08-13 DIAGNOSIS — R519 Headache, unspecified: Secondary | ICD-10-CM | POA: Diagnosis not present

## 2021-08-13 DIAGNOSIS — R42 Dizziness and giddiness: Secondary | ICD-10-CM | POA: Insufficient documentation

## 2021-08-13 DIAGNOSIS — R5383 Other fatigue: Secondary | ICD-10-CM | POA: Insufficient documentation

## 2021-08-13 LAB — BASIC METABOLIC PANEL
Anion gap: 10 (ref 5–15)
BUN: 14 mg/dL (ref 6–20)
CO2: 26 mmol/L (ref 22–32)
Calcium: 9.4 mg/dL (ref 8.9–10.3)
Chloride: 101 mmol/L (ref 98–111)
Creatinine, Ser: 0.62 mg/dL (ref 0.44–1.00)
GFR, Estimated: >60 mL/min (ref >60)
Glucose, Bld: 130 mg/dL — ABNORMAL HIGH (ref 70–99)
Potassium: 3.7 mmol/L (ref 3.5–5.1)
Sodium: 137 mmol/L (ref 135–145)

## 2021-08-13 LAB — URINALYSIS, ROUTINE W REFLEX MICROSCOPIC
Bilirubin Urine: NEGATIVE
Glucose, UA: NEGATIVE mg/dL
Hgb urine dipstick: NEGATIVE
Ketones, ur: NEGATIVE mg/dL
Leukocytes,Ua: NEGATIVE
Nitrite: NEGATIVE
Protein, ur: NEGATIVE mg/dL
Specific Gravity, Urine: 1.009 (ref 1.005–1.030)
pH: 6 (ref 5.0–8.0)

## 2021-08-13 LAB — CBC
HCT: 44 % (ref 36.0–46.0)
Hemoglobin: 14.9 g/dL (ref 12.0–15.0)
MCH: 29.2 pg (ref 26.0–34.0)
MCHC: 33.9 g/dL (ref 30.0–36.0)
MCV: 86.3 fL (ref 80.0–100.0)
Platelets: 259 10*3/uL (ref 150–400)
RBC: 5.1 MIL/uL (ref 3.87–5.11)
RDW: 11.6 % (ref 11.5–15.5)
WBC: 9.4 10*3/uL (ref 4.0–10.5)
nRBC: 0 % (ref 0.0–0.2)

## 2021-08-13 LAB — T4, FREE: Free T4: 0.74 ng/dL (ref 0.61–1.12)

## 2021-08-13 LAB — TSH: TSH: 0.673 u[IU]/mL (ref 0.350–4.500)

## 2021-08-13 LAB — PREGNANCY, URINE: Preg Test, Ur: NEGATIVE

## 2021-08-13 NOTE — ED Notes (Signed)
RN provided AVS using Teachback Method. Patient verbalizes understanding of Discharge Instructions. Opportunity for Questioning and Answers were provided by RN. Patient Discharged from ED ambulatory to Home via Self.  

## 2021-08-13 NOTE — ED Provider Notes (Signed)
[facility] DEPT Provider Note   CSN: [id number] Arrival date & time: 08/13/21  1228     History  Chief Complaint  Patient presents with   Tachycardia    Theresa Carlson is a 31 y.o. female.  HPI     Was walking [place] [employer], and felt dizzy, palpitations, since 11AM, was 180 on apple watch, was too dizzy to check at the time, sat in [employer] for 20 minutes 10-20 minutes was severe. Felt a little better after eating and drinking.  No chest pain or shortness of breath Had a few episodes in the past that got better after resting Now have headache, feel like concentration is off, fatigue Had an episode a month or 2 ago, and had another while pregnant Had CT with ENT for chronic sinusitis, and just had strep, thought had tonsil stone, had pain, thought had puss come out, that feels better now, throat and mouth are better No n/v/d/black or bloody stools, no cough (or same for one year), no new medication othter than the penicillin  Had some cereal, went to work out then [employer]  Still felt off after resting, eating, was still high  Is on birth control, no recent trips, surgeries, immobilization, hx of DVT/PE  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 Authorizing Provider  Budeson-Glycopyrrol-Formoterol (BREZTRI AEROSPHERE) 160-9-4.8 MCG/ACT AERO Inhale 1 puff into the lungs once a week.    [provider]  fluticasone (FLONASE) 50 MCG/ACT nasal spray  Place 2 sprays into both nostrils daily. 07/13/21   Nafziger, Tommi Rumps, NP  MILI 0.25-35 MG-MCG tablet Take 1 tablet by mouth at bedtime. 09/12/20   [provider]      Allergies    Patient has no known allergies.    Review of Systems   Review of Systems See above Physical Exam Updated Vital Signs BP 120/75    Pulse 88    Temp 97.7 F (36.5 C) (Oral)    Resp 19    Ht 5\' 5"  (1.651 m)    Wt 61.2 kg    SpO2 100%    BMI 22.45 kg/m  Physical Exam Vitals and nursing note reviewed.  Constitutional:      General: She is not in acute distress.    Appearance: She is well-developed. She is not diaphoretic.  HENT:     Head: Normocephalic and atraumatic.     Mouth/Throat:     Pharynx: No oropharyngeal exudate or posterior oropharyngeal erythema.  Eyes:     Conjunctiva/sclera: Conjunctivae normal.  Cardiovascular:     Rate and Rhythm: Normal rate and regular rhythm.     Heart sounds: Normal heart sounds. No murmur heard.   No friction rub. No gallop.  Pulmonary:     Effort: Pulmonary effort is normal. No respiratory distress.     Breath sounds: Normal breath sounds. No wheezing or rales.  Abdominal:     General: There is no distension.  Palpations: Abdomen is soft.     Tenderness: There is no abdominal tenderness. There is no guarding.  Musculoskeletal:        General: No tenderness.     Cervical back: Normal range of motion.  Skin:    General: Skin is warm and dry.     Findings: No erythema or rash.  Neurological:     Mental Status: She is alert and oriented to person, place, and time.    ED Results / Procedures / Treatments   Labs (all labs ordered are listed, but only abnormal results are displayed) Labs Reviewed  BASIC METABOLIC PANEL - Abnormal; Notable for the following components:      Result Value   Glucose, Bld 130 (*)    All other components within normal limits  URINALYSIS, ROUTINE W REFLEX MICROSCOPIC - Abnormal; Notable for the following components:    Color, Urine COLORLESS (*)    All other components within normal limits  CBC  PREGNANCY, URINE  TSH  T4, FREE    EKG EKG Interpretation  Date/Time:  Monday August 13 2021 12:35:03 EST Ventricular Rate:  113 PR Interval:  132 QRS Duration: 70 QT Interval:  334 QTC Calculation: 458 R Axis:   64 Text Interpretation: Sinus tachycardia Right atrial enlargement Borderline ECG When compared with ECG of 01-Sep-2019 09:05,  Rate increased, no other significant abnormalities Confirmed by Gareth Morgan [phone]) on 08/13/2021 1:42:18 PM  Radiology No results found.  Procedures Procedures    Medications Ordered in ED Medications - No data to display  ED Course/ Medical Decision Making/ A&P                           Medical Decision Making Amount and/or Complexity of Data Reviewed Labs: ordered.   31yo female with history of childhood asthma presents with concern for palpitations, dizziness, and fatigue.   EKG evaluated by me shows sinus tachycardia, no other signs of arrhythmia, pericarditis, ST changes.  Labs show no significant electrolyte abnormralities, no anemia. No sign of UTI on urinalysis.  Pregnancy test negative.   TSH/free T4 pending at time of discharge but did return within normal range. Discussed possibility of obtaining ddimer, however with no chest pain or dyspnea, no hypoxia, improvement in HR in the ED without intervention, no leg pain or swelling, have a low suspicion for PE and she agrees with foregoing ddimer.  Did exercise this AM, and possibly in setting of recent strep throat diagnosis led to dehydration and symptoms of fatigue, also given episodes of tachycardia in the past (x2) consider other cardiac arrhythmia that resolved prior to presentation.  Recommend hydration, cardiology follow up.  Patient discharged in stable condition with understanding of reasons to return.         Final Clinical Impression(s) / ED Diagnoses Final diagnoses:   Palpitations  Other fatigue    Rx / DC Orders ED Discharge Orders     None         Gareth Morgan, MD 08/14/21 [phone]

## 2021-08-13 NOTE — ED Triage Notes (Signed)
Patient here POV from Home.  Patient was at [employer] when she felt her HR was tachycardiac at 130 BPM. Noted Dizziness and Nausea with Same.   Patient went Home to Eat but symptoms have persisted since.   No Pain.  NAD noted during Triage. A&Ox4. GCS 15. Ambulatory.

## 2021-08-16 ENCOUNTER — Other Ambulatory Visit (HOSPITAL_COMMUNITY): Payer: Self-pay

## 2021-08-16 DIAGNOSIS — J329 Chronic sinusitis, unspecified: Secondary | ICD-10-CM | POA: Diagnosis not present

## 2021-08-16 MED ORDER — FLUCONAZOLE 100 MG PO TABS
100.0000 mg | ORAL_TABLET | ORAL | 1 refills | Status: DC
  Filled 2021-08-16: qty 5, 15d supply, fill #0

## 2021-08-16 MED ORDER — CLINDAMYCIN HCL 300 MG PO CAPS
300.0000 mg | ORAL_CAPSULE | Freq: Three times a day (TID) | ORAL | 0 refills | Status: DC
  Filled 2021-08-16: qty 42, 14d supply, fill #0

## 2021-08-17 ENCOUNTER — Emergency Department (HOSPITAL_COMMUNITY)
Admission: EM | Admit: 2021-08-17 | Discharge: 2021-08-17 | Disposition: A | Discharge status: Home / Self Care | Payer: 59 | Attending: Emergency Medicine | Admitting: Emergency Medicine

## 2021-08-17 ENCOUNTER — Encounter (HOSPITAL_COMMUNITY): Payer: Self-pay | Admitting: Emergency Medicine

## 2021-08-17 DIAGNOSIS — R002 Palpitations: Secondary | ICD-10-CM | POA: Insufficient documentation

## 2021-08-17 DIAGNOSIS — R079 Chest pain, unspecified: Secondary | ICD-10-CM

## 2021-08-17 DIAGNOSIS — N9489 Other specified conditions associated with female genital organs and menstrual cycle: Secondary | ICD-10-CM | POA: Diagnosis not present

## 2021-08-17 DIAGNOSIS — R0789 Other chest pain: Secondary | ICD-10-CM | POA: Diagnosis not present

## 2021-08-17 LAB — I-STAT BETA HCG BLOOD, ED (MC, WL, AP ONLY): I-stat hCG, quantitative: <5 m[IU]/mL (ref <5)

## 2021-08-17 LAB — BASIC METABOLIC PANEL
Anion gap: 8 (ref 5–15)
BUN: 11 mg/dL (ref 6–20)
CO2: 24 mmol/L (ref 22–32)
Calcium: 9.5 mg/dL (ref 8.9–10.3)
Chloride: 104 mmol/L (ref 98–111)
Creatinine, Ser: 0.6 mg/dL (ref 0.44–1.00)
GFR, Estimated: >60 mL/min (ref >60)
Glucose, Bld: 105 mg/dL — ABNORMAL HIGH (ref 70–99)
Potassium: 3.9 mmol/L (ref 3.5–5.1)
Sodium: 136 mmol/L (ref 135–145)

## 2021-08-17 LAB — CBC
HCT: 42.9 % (ref 36.0–46.0)
Hemoglobin: 14.3 g/dL (ref 12.0–15.0)
MCH: 29.2 pg (ref 26.0–34.0)
MCHC: 33.3 g/dL (ref 30.0–36.0)
MCV: 87.6 fL (ref 80.0–100.0)
Platelets: 223 10*3/uL (ref 150–400)
RBC: 4.9 MIL/uL (ref 3.87–5.11)
RDW: 11.5 % (ref 11.5–15.5)
WBC: 5.4 10*3/uL (ref 4.0–10.5)
nRBC: 0 % (ref 0.0–0.2)

## 2021-08-17 LAB — TROPONIN I (HIGH SENSITIVITY)
Troponin I (High Sensitivity): 2 ng/L (ref <18)
Troponin I (High Sensitivity): 3 ng/L (ref <18)

## 2021-08-17 LAB — D-DIMER, QUANTITATIVE: D-Dimer, Quant: <0.27 ug/mL-FEU (ref 0.00–0.50)

## 2021-08-17 NOTE — ED Provider Notes (Signed)
[facility] EMERGENCY DEPARTMENT Provider Note   CSN: [id number] Arrival date & time: 08/17/21  0750     History  Chief Complaint  Patient presents with   Chest Pain    Theresa Carlson is a 31 y.o. female.  Patient is a 31 year old female with significant past medical history of chronic bronchitis and cough for the last year and occasional episodes of palpitations who was seen in the emergency room earlier this week for an episode of palpitations lightheadedness that did not resolve with rest who is returning today because of recurrent episode of palpitations but this time also with chest discomfort.  Patient reports that since her visit to the emergency room she had been okay until today she was at work when she started feeling her heart rate elevated again which she had on her Apple Watch and it was around 120 which she did an EKG of and she reports it being sinus tachycardia.  However today when she had the episode she was not dizzy but started having a tight discomfort in the center of her chest that radiated between her shoulder blades.  It is now resolved.  Does not seem to be worse with exertion.  She denies any unilateral leg pain or swelling.  No recent surgeries or immobilizations.  She does take birth control.  She has no family history of cardiac disease or clots.  She returns today because she just wanted to make sure everything was checked.  When she was at the emergency room earlier this week she had normal basic labs as well as normal TSH and T4.  She does have follow-up appointment with cardiology at the beginning of March.  Only difference in medication she started taking clindamycin yesterday after seeing ENT for this chronic sinusitis.  The history is provided by the patient.  Chest Pain     Home Medications Prior to Admission medications   Medication Sig Start Date End Date Taking? Authorizing Provider  benzonatate (TESSALON) 200 MG capsule Take 200 mg  by mouth at bedtime as needed for cough.   Yes [provider]  Budeson-Glycopyrrol-Formoterol (BREZTRI AEROSPHERE) 160-9-4.8 MCG/ACT AERO Inhale 1 puff into the lungs daily as needed (shortness of breath).   Yes [provider]  clindamycin (CLEOCIN) 300 MG capsule Take 1 capsule (300 mg total) by mouth 3 (three) times daily for 14 days 08/16/21  Yes   fluconazole (DIFLUCAN) 100 MG tablet Take 1 tablet (100 mg total) by mouth every 3-4 days  while taking antibiotic Patient taking differently: Take 100 mg by mouth See admin instructions. Take one tablet (100 mg) by mouth once every 3-4 days while taking antibiotic 08/16/21  Yes Izora Gala, MD  ibuprofen (ADVIL) 200 MG tablet Take 400 mg by mouth every 6 (six) hours as needed for headache.   Yes [provider]  norgestimate-ethinyl estradiol (ORTHO-CYCLEN) 0.25-35 MG-MCG tablet Take 1 tablet by mouth every morning.   Yes [provider]  OVER THE COUNTER MEDICATION Take 1 packet by mouth every morning. AG1 athletic greens - powder   Yes [provider]  fluticasone (FLONASE) 50 MCG/ACT nasal spray Place 2 sprays into both nostrils daily. Patient not taking: Reported on 08/17/2021 07/13/21   Dorothyann Peng, NP      Allergies    Patient has no known allergies.    Review of Systems   Review of Systems  Cardiovascular:  Positive for chest pain.   Physical Exam Updated Vital Signs BP  115/73    Pulse 70    Temp 98.3 F (36.8 C) (Oral)    Resp 15    SpO2 98%  Physical Exam Vitals and nursing note reviewed.  Constitutional:      General: She is not in acute distress.    Appearance: Normal appearance. She is well-developed.  HENT:     Head: Normocephalic and atraumatic.  Eyes:     Pupils: Pupils are equal, round, and reactive to light.  Cardiovascular:     Rate and Rhythm: Normal rate and regular rhythm.     Pulses: Normal pulses.     Heart sounds: Normal heart sounds. No murmur heard.   No  friction rub.  Pulmonary:     Effort: Pulmonary effort is normal.     Breath sounds: Normal breath sounds. No wheezing or rales.  Musculoskeletal:        General: No tenderness. Normal range of motion.     Right lower leg: No edema.     Left lower leg: No edema.     Comments: No edema  Skin:    General: Skin is warm and dry.     Findings: No rash.  Neurological:     Mental Status: She is alert and oriented to person, place, and time. Mental status is at baseline.     Cranial Nerves: No cranial nerve deficit.  Psychiatric:        Mood and Affect: Mood normal.        Behavior: Behavior normal.    ED Results / Procedures / Treatments   Labs (all labs ordered are listed, but only abnormal results are displayed) Labs Reviewed  BASIC METABOLIC PANEL - Abnormal; Notable for the following components:      Result Value   Glucose, Bld 105 (*)    All other components within normal limits  CBC  D-DIMER, QUANTITATIVE  I-STAT BETA HCG BLOOD, ED ([facility], [facility], [facility] ONLY)  TROPONIN I (HIGH SENSITIVITY)  TROPONIN I (HIGH SENSITIVITY)    EKG EKG Interpretation  Date/Time:  Friday August 17 2021 07:50:45 EST Ventricular Rate:  84 PR Interval:  128 QRS Duration: 72 QT Interval:  342 QTC Calculation: 404 R Axis:   40 Text Interpretation: Normal sinus rhythm Normal ECG When compared with ECG of 13-Aug-2021 12:35, PREVIOUS ECG IS PRESENT Confirmed by Blanchie Dessert [phone]) on 08/17/2021 8:46:42 AM  Radiology No results found.  Procedures Procedures    Medications Ordered in ED Medications - No data to display  ED Course/ Medical Decision Making/ A&P                           Medical Decision Making Amount and/or Complexity of Data Reviewed Labs: ordered.   Patient is a well-appearing 31 year old female presenting today with recurrent palpitations but new onset chest pain.  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  She denied any shortness of breath with this but does have an Apple Watch on and noted her heart rate was in the 120s.  She did snap an EKG but reports it was a sinus rhythm.  This is now the fourth episode in the last few months.  She did start clindamycin yesterday but no other acute medication changes.  She does not drink excessive caffeine.  She has been trying to hydrate.  On exam she is well-appearing in no acute findings at this time heart rate is between 70 and 80.  When standing it goes to 90 but all appears to be sinus rhythm.  Patient is low risk Wells criteria but she is on birth control so we will do a D-dimer which was discussed with her.  Also she is requesting a troponin.  Low suspicion for ACS and patient has a low heart score.  Low suspicion for dissection at this time.  No findings to suggest pericarditis, myocarditis and low suspicion of acute lung pathology.  I independently interpreted patient's EKG which was normal and her labs were wnl, including delta trop and dimer.    11:15 AM Findings were discussed with the patient.  Her vital signs remain normal here.  She has no social determinants affecting her discharge today and has outpatient follow-up established.  She has no further questions does not meet any criteria for admission and was discharged home.        Final Clinical Impression(s) / ED Diagnoses Final diagnoses:  Nonspecific chest pain  Palpitations    Rx / DC Orders ED Discharge Orders     None         Blanchie Dessert, MD 08/17/21 1116

## 2021-08-17 NOTE — Discharge Instructions (Signed)
Troponin and d-dimer were normal today.  Continue to avoid caffiene and stay hydrated.  Unclear at this time why this is occurring but so far all test has been normal.

## 2021-08-17 NOTE — ED Triage Notes (Signed)
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

## 2021-08-17 NOTE — ED Notes (Signed)
Pt ambulated to restroom without incident.

## 2021-08-22 ENCOUNTER — Ambulatory Visit (INDEPENDENT_AMBULATORY_CARE_PROVIDER_SITE_OTHER): Payer: 59

## 2021-08-22 ENCOUNTER — Ambulatory Visit: Payer: 59 | Admitting: Cardiovascular Disease

## 2021-08-22 ENCOUNTER — Other Ambulatory Visit: Payer: Self-pay

## 2021-08-22 ENCOUNTER — Encounter: Payer: Self-pay | Admitting: Cardiovascular Disease

## 2021-08-22 VITALS — BP 108/70 | HR 81 | Ht 65.0 in | Wt 138.8 lb

## 2021-08-22 DIAGNOSIS — R002 Palpitations: Secondary | ICD-10-CM

## 2021-08-22 NOTE — Patient Instructions (Signed)
Medication Instructions:  Your physician recommends that you continue on your current medications as directed. Please refer to the Current Medication list given to you today.  *If you need a refill on your cardiac medications before your next appointment, please call your pharmacy*   Lab Work: NONE If you have labs (blood work) drawn today and your tests are completely normal, you will receive your results only by: MyChart Message (if you have MyChart) OR A paper copy in the mail If you have any lab test that is abnormal or we need to change your treatment, we will call you to review the results.   Testing/Procedures: Your physician has recommended for you to wear a heart monitor.  Your physician has requested that you have an echocardiogram. Echocardiography is a painless test that uses sound waves to create images of your heart. It provides your doctor with information about the size and shape of your heart and how well your hearts chambers and valves are working. This procedure takes approximately one hour. There are no restrictions for this procedure.    Follow-Up: At [facility], you and your health needs are our priority.  As part of our continuing mission to provide you with exceptional heart care, we have created designated Provider Care Teams.  These Care Teams include your primary Cardiologist (physician) and Advanced Practice Providers (APPs -  Physician Assistants and Nurse Practitioners) who all work together to provide you with the care you need, when you need it.   Your next appointment:   6-8 week(s)  The format for your next appointment:   In Person  Provider:   Verne Carrow, MD    Other Instructions Christena Deem- Long Term Monitor Instructions  Your physician has requested you wear a ZIO patch monitor for 14 days.  This is a single patch monitor. [employer] supplies one patch monitor per enrollment. Additional stickers are not available. Please do not apply  patch if you will be having a Nuclear Stress Test,  Echocardiogram, Cardiac CT, MRI, or Chest Xray during the period you would be wearing the  monitor. The patch cannot be worn during these tests. You cannot remove and re-apply the  ZIO XT patch monitor.  Your ZIO patch monitor will be mailed 3 day USPS to your address on file. It may take 3-5 days  to receive your monitor after you have been enrolled.  Once you have received your monitor, please review the enclosed instructions. Your monitor  has already been registered assigning a specific monitor serial # to you.  Billing and Patient Assistance Program Information  We have supplied [employer] with any of your insurance information on file for billing purposes. [employer] offers a sliding scale Patient Assistance Program for patients that do not have  insurance, or whose insurance does not completely cover the cost of the ZIO monitor.  You must apply for the Patient Assistance Program to qualify for this discounted rate.  To apply, please call [employer] at [phone], select option 4, select option 2, ask to apply for  Patient Assistance Program. Meredeth Ide will ask your household income, and how many people  are in your household. They will quote your out-of-pocket cost based on that information.  [employer] will also be able to set up a 22-month, interest-free payment plan if needed.  Applying the monitor   Shave hair from upper left chest.  Hold abrader disc by orange tab. Rub abrader in 40 strokes over the upper left chest as  indicated in  your monitor instructions.  Clean area with 4 enclosed alcohol pads. Let dry.  Apply patch as indicated in monitor instructions. Patch will be placed under collarbone on left  side of chest with arrow pointing upward.  Rub patch adhesive wings for 2 minutes. Remove white label marked "1". Remove the white  label marked "2". Rub patch adhesive wings for 2 additional minutes.  While looking in a mirror, press  and release button in center of patch. A small green light will  flash 3-4 times. This will be your only indicator that the monitor has been turned on.  Do not shower for the first 24 hours. You may shower after the first 24 hours.  Press the button if you feel a symptom. You will hear a small click. Record Date, Time and  Symptom in the Patient Logbook.  When you are ready to remove the patch, follow instructions on the last 2 pages of Patient  Logbook. Stick patch monitor onto the last page of Patient Logbook.  Place Patient Logbook in the blue and white box. Use locking tab on box and tape box closed  securely. The blue and white box has prepaid postage on it. Please place it in the mailbox as  soon as possible. Your physician should have your test results approximately 7 days after the  monitor has been mailed back to [facility].  Call [facility] Customer Care at [phone] if you have questions regarding  your ZIO XT patch monitor. Call them immediately if you see an orange light blinking on your  monitor.  If your monitor falls off in less than 4 days, contact our Monitor department at [phone].  If your monitor becomes loose or falls off after 4 days call [employer] at [phone] for  suggestions on securing your monitor

## 2021-08-22 NOTE — Progress Notes (Signed)
Chief Complaint  Patient presents with   New Patient (Initial Visit)    Palpitations    History of Present Illness: 31 yo female with history of childhood asthma but no other chronic problems here today as a new patient for the evaluation of palpitations. She was seen in the ED at [facility] twice in the last week with palpitations. She feels  her heart start to race and then she feels hot. She had some chest pain last week when her heart was racing. This was epigastric pain with radiation under her right breast and to her back. EKG in the ED 08/13/21 with sinus tachycardia at 113 bpm. EKG last week with sinus. TSH normal last week. D-dimer normal. She has been trying to avoid caffeine. 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 No prior cardiac issues. No family history of premature CAd.   Primary Care Physician: Dorothyann Peng, NP  Past Medical History:  Diagnosis Date   Asthma    as a child   Missed abortion 02/11/2019   S/P dilatation and curettage 02/11/2019   SVD (spontaneous vaginal delivery) 12/17/2019    Past Surgical History:  Procedure Laterality Date   DILATION AND EVACUATION N/A 02/11/2019   Procedure: DILATATION AND EVACUATION;  Surgeon: Janyth Contes, MD;  Location: [place];  Service: Gynecology;  Laterality: N/A;   trigger thumb      Current Outpatient Medications  Medication Sig Dispense Refill   benzonatate (TESSALON) 200 MG capsule Take 200 mg by mouth at bedtime as needed for cough.     Budeson-Glycopyrrol-Formoterol (BREZTRI AEROSPHERE) 160-9-4.8 MCG/ACT AERO Inhale 1 puff into the lungs daily as needed (shortness of breath).     clindamycin (CLEOCIN) 300 MG capsule Take 1 capsule (300 mg total) by mouth 3 (three) times daily for 14 days 42 capsule 0   fluconazole (DIFLUCAN) 100 MG tablet Take 1 tablet (100 mg total) by mouth every 3-4 days  while taking antibiotic (Patient taking differently: Take 100 mg by mouth See admin instructions. Take one tablet (100 mg) by  mouth once every 3-4 days while taking antibiotic) 5 tablet 1   ibuprofen (ADVIL) 200 MG tablet Take 400 mg by mouth every 6 (six) hours as needed for headache.     norgestimate-ethinyl estradiol (ORTHO-CYCLEN) 0.25-35 MG-MCG tablet Take 1 tablet by mouth every morning.     OVER THE COUNTER MEDICATION Take 1 packet by mouth every morning. AG1 athletic greens - powder     fluticasone (FLONASE) 50 MCG/ACT nasal spray Place 2 sprays into both nostrils daily. (Patient not taking: Reported on 08/17/2021) 16 g 6   No current facility-administered medications for this visit.    No Known Allergies  Social History   Socioeconomic History   Marital status: Married    Spouse name: Not on file   Number of children: Not on file   Years of education: Not on file   Highest education level: Master's degree (e.g., MA, MS, MEng, MEd, MSW, MBA)  Occupational History   Occupation: Programmer, multimedia: [employer]  Tobacco Use   Smoking status: Never   Smokeless tobacco: Never  Vaping Use   Vaping Use: Never used  Substance and Sexual Activity   Alcohol use: Not Currently    Alcohol/week: 1.0 standard drink    Types: 1 Glasses of wine per week    Comment: occasonally    Drug use: No   Sexual activity: Yes  Other Topics Concern  Not on file  Social History Narrative   Not on file   Social Determinants of Health   Financial Resource Strain: Low Risk    Difficulty of Paying Living Expenses: Not hard at all  Food Insecurity: No Food Insecurity   Worried About Charity fundraiser in the Last Year: Never true   Arboriculturist in the Last Year: Never true  Transportation Needs: No Transportation Needs   Lack of Transportation (Medical): No   Lack of Transportation (Non-Medical): No  Physical Activity: Insufficiently Active   Days of Exercise per Week: 2 days   Minutes of Exercise per Session: 30 min  Stress: No Stress Concern Present   Feeling of Stress : Not at all  Social Connections:  Socially Integrated   Frequency of Communication with Friends and Family: More than three times a week   Frequency of Social Gatherings with Friends and Family: Once a week   Attends Religious Services: More than 4 times per year   Active Member of [employer] or Organizations: Yes   Attends Music therapist: More than 4 times per year   Marital Status: Married  Human resources officer Violence: Not on file    Family History  Problem Relation Age of Onset   Hypertension Mother    Eczema Mother    Hypertension Father    Stroke Maternal Grandfather    Prostate cancer Maternal Grandfather    Diabetes Maternal Grandfather    Breast cancer Paternal Grandmother    Lung cancer Paternal Grandfather     Review of Systems:  As stated in the HPI and otherwise negative.   BP 108/70    Pulse 81    Ht 5\' 5"  (1.651 m)    Wt 138 lb 12.8 oz (63 kg)    SpO2 98%    BMI 23.10 kg/m   Physical Examination: General: Well developed, well nourished, NAD  HEENT: OP clear, mucus membranes moist  SKIN: warm, dry. No rashes. Neuro: No focal deficits  Musculoskeletal: Muscle strength 5/5 all ext  Psychiatric: Mood and affect normal  Neck: No JVD, no carotid bruits, no thyromegaly, no lymphadenopathy.  Lungs:Clear bilaterally, no wheezes, rhonci, crackles Cardiovascular: Regular rate and rhythm. No murmurs, gallops or rubs. Abdomen:Soft. Bowel sounds present. Non-tender.  Extremities: No lower extremity edema. Pulses are 2 + in the bilateral DP/PT.  EKG:  EKG is not ordered today. The ekg ordered today demonstrates  EKG from 08/17/21 reviewed by me and shows sinus  Recent Labs: 11/02/2020: ALT 17 08/13/2021: TSH 0.673 08/17/2021: BUN 11; Creatinine, Ser 0.60; Hemoglobin 14.3; Platelets 223; Potassium 3.9; Sodium 136   Lipid Panel    Component Value Date/Time   CHOL 152 11/02/2020 0900   TRIG 56.0 11/02/2020 0900   HDL 65.20 11/02/2020 0900   CHOLHDL 2 11/02/2020 0900   VLDL 11.2 11/02/2020 0900    LDLCALC 76 11/02/2020 0900     Wt Readings from Last 3 Encounters:  08/22/21 138 lb 12.8 oz (63 kg)  08/13/21 134 lb 14.7 oz (61.2 kg)  07/13/21 135 lb (61.2 kg)     Assessment and Plan:   1. Palpitations: Will arrange 14 day Zio monitor. Will arrange echocardiogram to assess LVEF and exclude structural heart disease.   Labs/ tests ordered today include:   Orders Placed This Encounter  Procedures   LONG TERM MONITOR (3-14 DAYS)   ECHOCARDIOGRAM COMPLETE     Disposition:   F/U with me in 6-8 weeks.  Signed, Lauree Chandler, MD 08/22/2021 10:28 AM    [facility] [place], [place], [place]  [address] Phone: [phone]; Fax: [phone]

## 2021-08-22 NOTE — Progress Notes (Unsigned)
Applied a 14 day Zio XT monitor to patient in office 

## 2021-08-31 ENCOUNTER — Other Ambulatory Visit: Payer: Self-pay

## 2021-08-31 ENCOUNTER — Ambulatory Visit: Payer: 59 | Admitting: Cardiology

## 2021-08-31 ENCOUNTER — Ambulatory Visit (HOSPITAL_COMMUNITY): Payer: 59 | Attending: Cardiology

## 2021-08-31 DIAGNOSIS — R002 Palpitations: Secondary | ICD-10-CM | POA: Diagnosis not present

## 2021-08-31 LAB — ECHOCARDIOGRAM COMPLETE
Area-P 1/2: 4.33 cm2
S' Lateral: 2.1 cm

## 2021-09-07 ENCOUNTER — Other Ambulatory Visit (HOSPITAL_COMMUNITY): Payer: 59

## 2021-09-07 ENCOUNTER — Ambulatory Visit: Payer: 59 | Admitting: Cardiovascular Disease

## 2021-09-07 DIAGNOSIS — R002 Palpitations: Secondary | ICD-10-CM | POA: Diagnosis not present

## 2021-10-15 ENCOUNTER — Encounter: Payer: Self-pay | Admitting: Cardiovascular Disease

## 2021-10-15 ENCOUNTER — Ambulatory Visit: Payer: 59 | Admitting: Cardiovascular Disease

## 2021-10-15 VITALS — BP 124/78 | HR 96 | Ht 65.0 in | Wt 140.0 lb

## 2021-10-15 DIAGNOSIS — R002 Palpitations: Secondary | ICD-10-CM | POA: Diagnosis not present

## 2021-10-15 NOTE — Progress Notes (Signed)
? ?Chief Complaint  ?Patient presents with  ? Follow-up  ?  Tachycardia  ? ?History of Present Illness: 31 yo female with history of childhood asthma but no other chronic problems here today for follow up. I saw her as a new patient for the evaluation of palpitations on 08/22/21. She was seen in the ED at [facility] twice with palpitations the week before being seen in our office. She feels  her heart start to race and then she feels hot. She had some chest pain when her heart was racing. This was epigastric pain with radiation under her right breast and to her back. EKG in the ED 08/13/21 with sinus tachycardia at 113 bpm. TSH  and D-dimer normal. 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 Echo March 2023 with LVEF=60-65%. No valve disease. Cardiac monitor with sinus with rare PVC/PACs. Triggered events were sinus tachycardia.  ? ?She is here today for follow up. The patient denies any chest pain, dyspnea, lower extremity edema, orthopnea, PND, dizziness, near syncope or syncope. One episode of palpitations since taking off her monitor.  ? ?Primary Care Physician: Dorothyann Peng, NP ? ?Past Medical History:  ?Diagnosis Date  ? Asthma   ? as a child  ? Missed abortion 02/11/2019  ? S/P dilatation and curettage 02/11/2019  ? SVD (spontaneous vaginal delivery) 12/17/2019  ? ? ?Past Surgical History:  ?Procedure Laterality Date  ? DILATION AND EVACUATION N/A 02/11/2019  ? Procedure: DILATATION AND EVACUATION;  Surgeon: Janyth Contes, MD;  Location: [place];  Service: Gynecology;  Laterality: N/A;  ? trigger thumb    ? ? ?Current Outpatient Medications  ?Medication Sig Dispense Refill  ? benzonatate (TESSALON) 200 MG capsule Take 200 mg by mouth at bedtime as needed for cough.    ? Budeson-Glycopyrrol-Formoterol (BREZTRI AEROSPHERE) 160-9-4.8 MCG/ACT AERO Inhale 1 puff into the lungs daily as needed (shortness of breath).    ? ibuprofen (ADVIL) 200 MG tablet Take 400 mg by mouth every 6 (six) hours as needed for headache.     ? norgestimate-ethinyl estradiol (ORTHO-CYCLEN) 0.25-35 MG-MCG tablet Take 1 tablet by mouth every morning.    ? OVER THE COUNTER MEDICATION Take 1 packet by mouth every morning. AG1 athletic greens - powder    ? ?No current facility-administered medications for this visit.  ? ? ?No Known Allergies ? ?Social History  ? ?Socioeconomic History  ? Marital status: Married  ?  Spouse name: Not on file  ? Number of children: Not on file  ? Years of education: Not on file  ? Highest education level: Master's degree (e.g., MA, MS, MEng, MEd, MSW, MBA)  ?Occupational History  ? Occupation: Therapist, sports  ?  Employer: [employer]  ?Tobacco Use  ? Smoking status: Never  ? Smokeless tobacco: Never  ?Vaping Use  ? Vaping Use: Never used  ?Substance and Sexual Activity  ? Alcohol use: Not Currently  ?  Alcohol/week: 1.0 standard drink  ?  Types: 1 Glasses of wine per week  ?  Comment: occasonally   ? Drug use: No  ? Sexual activity: Yes  ?Other Topics Concern  ? Not on file  ?Social History Narrative  ? Not on file  ? ?Social Determinants of Health  ? ?Financial Resource Strain: Low Risk   ? Difficulty of Paying Living Expenses: Not hard at all  ?Food Insecurity: No Food Insecurity  ? Worried About Charity fundraiser in the Last Year: Never true  ? Ran Out  of Food in the Last Year: Never true  ?Transportation Needs: No Transportation Needs  ? Lack of Transportation (Medical): No  ? Lack of Transportation (Non-Medical): No  ?Physical Activity: Insufficiently Active  ? Days of Exercise per Week: 2 days  ? Minutes of Exercise per Session: 30 min  ?Stress: No Stress Concern Present  ? Feeling of Stress : Not at all  ?Social Connections: Socially Integrated  ? Frequency of Communication with Friends and Family: More than three times a week  ? Frequency of Social Gatherings with Friends and Family: Once a week  ? Attends Religious Services: More than 4 times per year  ? Active Member of Clubs or Organizations: Yes  ? Attends Theatre manager Meetings: More than 4 times per year  ? Marital Status: Married  ?Intimate Partner Violence: Not on file  ? ? ?Family History  ?Problem Relation Age of Onset  ? Hypertension Mother   ? Eczema Mother   ? Hypertension Father   ? Stroke Maternal Grandfather   ? Prostate cancer Maternal Grandfather   ? Diabetes Maternal Grandfather   ? Breast cancer Paternal Grandmother   ? Lung cancer Paternal Grandfather   ? ? ?Review of Systems:  As stated in the HPI and otherwise negative.  ? ?BP 124/78   Pulse 96   Ht 5\' 5"  (1.651 m)   Wt 140 lb (63.5 kg)   SpO2 99%   BMI 23.30 kg/m?  ? ?Physical Examination: ?General: Well developed, well nourished, NAD  ?HEENT: OP clear, mucus membranes moist  ?SKIN: warm, dry. No rashes. ?Neuro: No focal deficits  ?Musculoskeletal: Muscle strength 5/5 all ext  ?Psychiatric: Mood and affect normal  ?Neck: No JVD, no carotid bruits, no thyromegaly, no lymphadenopathy.  ?Lungs:Clear bilaterally, no wheezes, rhonci, crackles ?Cardiovascular: Regular rate and rhythm. No murmurs, gallops or rubs. ?Abdomen:Soft. Bowel sounds present. Non-tender.  ?Extremities: No lower extremity edema. Pulses are 2 + in the bilateral DP/PT. ? ?EKG:  EKG is not ordered today. ?The ekg ordered today demonstrates  ? ?Echo 08/31/21: ? 1. Left ventricular ejection fraction, by estimation, is 60 to 65%. The  ?left ventricle has normal function. The left ventricle has no regional  ?wall motion abnormalities. Left ventricular diastolic parameters were  ?normal.  ? 2. Right ventricular systolic function is normal. The right ventricular  ?size is normal.  ? 3. The mitral valve is normal in structure. Trivial mitral valve  ?regurgitation. No evidence of mitral stenosis.  ? 4. The aortic valve is tricuspid. Aortic valve regurgitation is not  ?visualized. No aortic stenosis is present.  ? 5. The inferior vena cava is normal in size with greater than 50%  ?respiratory variability, suggesting right atrial pressure of  3 mmHg.  ? ?Recent Labs: ?11/02/2020: ALT 17 ?08/13/2021: TSH 0.673 ?08/17/2021: BUN 11; Creatinine, Ser 0.60; Hemoglobin 14.3; Platelets 223; Potassium 3.9; Sodium 136  ? ?Lipid Panel ?   ?Component Value Date/Time  ? CHOL 152 11/02/2020 0900  ? TRIG 56.0 11/02/2020 0900  ? HDL 65.20 11/02/2020 0900  ? CHOLHDL 2 11/02/2020 0900  ? VLDL 11.2 11/02/2020 0900  ? LDLCALC 76 11/02/2020 0900  ? ?  ?Wt Readings from Last 3 Encounters:  ?10/15/21 140 lb (63.5 kg)  ?08/22/21 138 lb 12.8 oz (63 kg)  ?08/13/21 134 lb 14.7 oz (61.2 kg)  ?  ?Assessment and Plan:  ? ?1. Palpitations: Likely due to sinus tachycardia, PVCs and PACs. Echo normal. I have reviewed her findings today.  I would not suggest any medical therapy at this time. 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 I will be happy to review with her as needed.  ? ?Labs/ tests ordered today include:  ? ?No orders of the defined types were placed in this encounter. ? ?Disposition:   F/U with me as needed ? ?Signed, ?Lauree Chandler, MD ?10/15/2021 11:12 AM    ?[facility] ?[address], [place], [place]  [id number] ?Phone: [phone]; Fax: [phone]  ? ? ?

## 2021-10-15 NOTE — Patient Instructions (Signed)
Medication Instructions:  No changes *If you need a refill on your cardiac medications before your next appointment, please call your pharmacy*   Lab Work: none If you have labs (blood work) drawn today and your tests are completely normal, you will receive your results only by: MyChart Message (if you have MyChart) OR A paper copy in the mail If you have any lab test that is abnormal or we need to change your treatment, we will call you to review the results.   Testing/Procedures: none   Follow-Up: As needed  Important Information About Sugar       

## 2021-10-19 ENCOUNTER — Ambulatory Visit: Payer: 59 | Admitting: Adult Health

## 2021-10-19 ENCOUNTER — Encounter: Payer: Self-pay | Admitting: Adult Health

## 2021-10-19 VITALS — BP 100/68 | HR 77 | Temp 98.6°F | Ht 65.0 in | Wt 139.0 lb

## 2021-10-19 DIAGNOSIS — Z77120 Contact with and (suspected) exposure to mold (toxic): Secondary | ICD-10-CM

## 2021-10-19 NOTE — Progress Notes (Signed)
? ?Subjective:  ? ? Patient ID: Theresa Carlson, female    DOB: 02-06-1991, 31 y.o.   MRN: [id number] ? ?HPI ?31 year old female who  has a past medical history of Asthma, Missed abortion (02/11/2019), S/P dilatation and curettage (02/11/2019), and SVD (spontaneous vaginal delivery) (12/17/2019). ? ?Has had recurrent sinus infections over the last year. Works at [employer]. Had another employee come into her office and reported that after leaving her office started to cough and have upper respiratory symptoms. Found out she was likely exposed to mold. She wold like testing for mold exposure.  ? ?[place] leaving [facility] in a [place].  ? ? ?Review of Systems ?See HPI  ? ?Past Medical History:  ?Diagnosis Date  ? Asthma   ? as a child  ? Missed abortion 02/11/2019  ? S/P dilatation and curettage 02/11/2019  ? SVD (spontaneous vaginal delivery) 12/17/2019  ? ? ?Social History  ? ?Socioeconomic History  ? Marital status: Married  ?  Spouse name: Not on file  ? Number of children: Not on file  ? Years of education: Not on file  ? Highest education level: Master's degree (e.g., MA, MS, MEng, MEd, MSW, MBA)  ?Occupational History  ? Occupation: Charity fundraiser  ?  Employer: [employer]  ?Tobacco Use  ? Smoking status: Never  ? Smokeless tobacco: Never  ?Vaping Use  ? Vaping Use: Never used  ?Substance and Sexual Activity  ? Alcohol use: Not Currently  ?  Alcohol/week: 1.0 standard drink  ?  Types: 1 Glasses of wine per week  ?  Comment: occasonally   ? Drug use: No  ? Sexual activity: Yes  ?Other Topics Concern  ? Not on file  ?Social History Narrative  ? Not on file  ? ?Social Determinants of Health  ? ?Financial Resource Strain: Low Risk   ? Difficulty of Paying Living Expenses: Not hard at all  ?Food Insecurity: No Food Insecurity  ? Worried About Programme researcher, broadcasting/film/video in the Last Year: Never true  ? Ran Out of Food in the Last Year: Never true  ?Transportation Needs: No Transportation Needs  ? Lack of Transportation  (Medical): No  ? Lack of Transportation (Non-Medical): No  ?Physical Activity: Insufficiently Active  ? Days of Exercise per Week: 2 days  ? Minutes of Exercise per Session: 30 min  ?Stress: No Stress Concern Present  ? Feeling of Stress : Not at all  ?Social Connections: Socially Integrated  ? Frequency of Communication with Friends and Family: More than three times a week  ? Frequency of Social Gatherings with Friends and Family: Once a week  ? Attends Religious Services: More than 4 times per year  ? Active Member of Clubs or Organizations: Yes  ? Attends Banker Meetings: More than 4 times per year  ? Marital Status: Married  ?Intimate Partner Violence: Not on file  ? ? ?Past Surgical History:  ?Procedure Laterality Date  ? DILATION AND EVACUATION N/A 02/11/2019  ? Procedure: DILATATION AND EVACUATION;  Surgeon: Sherian Rein, MD;  Location: [facility];  Service: Gynecology;  Laterality: N/A;  ? trigger thumb    ? ? ?Family History  ?Problem Relation Age of Onset  ? Hypertension Mother   ? Eczema Mother   ? Hypertension Father   ? Stroke Maternal Grandfather   ? Prostate cancer Maternal Grandfather   ? Diabetes Maternal Grandfather   ? Breast cancer Paternal Grandmother   ?  Lung cancer Paternal Grandfather   ? ? ?No Known Allergies ? ?Current Outpatient Medications on File Prior to Visit  ?Medication Sig Dispense Refill  ? benzonatate (TESSALON) 200 MG capsule Take 200 mg by mouth at bedtime as needed for cough.    ? Budeson-Glycopyrrol-Formoterol (BREZTRI AEROSPHERE) 160-9-4.8 MCG/ACT AERO Inhale 1 puff into the lungs daily as needed (shortness of breath).    ? ibuprofen (ADVIL) 200 MG tablet Take 400 mg by mouth every 6 (six) hours as needed for headache.    ? norgestimate-ethinyl estradiol (ORTHO-CYCLEN) 0.25-35 MG-MCG tablet Take 1 tablet by mouth every morning.    ? OVER THE COUNTER MEDICATION Take 1 packet by mouth every morning. AG1 athletic greens - powder    ? ?No current  facility-administered medications on file prior to visit.  ? ? ?BP 100/68   Pulse 77   Temp 98.6 ?F (37 ?C) (Oral)   Ht 5\' 5"  (1.651 m)   Wt 139 lb (63 kg)   SpO2 97%   BMI 23.13 kg/m?  ? ? ?   ?Objective:  ? Physical Exam ?Vitals and nursing note reviewed.  ?Constitutional:   ?   Appearance: Normal appearance.  ?Skin: ?   General: Skin is warm and dry.  ?   Capillary Refill: Capillary refill takes less than 2 seconds.  ?Neurological:  ?   General: No focal deficit present.  ?   Mental Status: She is alert and oriented to person, place, and time.  ?Psychiatric:     ?   Mood and Affect: Mood normal.     ?   Behavior: Behavior normal.     ?   Thought Content: Thought content normal.     ?   Judgment: Judgment normal.  ? ?   ?Assessment & Plan:  ? ?1. Mold exposure ? ?- Allergen Profile, Mold; Future ?- Allergen Profile, Mold ? ? ? , NP ? ?

## 2021-10-19 NOTE — Patient Instructions (Signed)
Health Maintenance Due  ?Topic Date Due  ? Hepatitis C Screening  Never done  ? ? ? ? Row Labels 11/02/2020  ?  8:39 AM 10/27/2019  ?  9:20 AM 04/28/2017  ?  9:17 AM  ?Depression screen PHQ 2/9   Section Header. No data exists in this row.     ?Decreased Interest   0 0 0  ?Down, Depressed, Hopeless   0 0 0  ?PHQ - 2 Score   0 0 0  ?Altered sleeping   0    ?Tired, decreased energy   0    ?Change in appetite   0    ?Feeling bad or failure about yourself    0    ?Trouble concentrating   0    ?Moving slowly or fidgety/restless   0    ?Suicidal thoughts   0    ?PHQ-9 Score   0    ? ? ?

## 2021-10-23 LAB — ALLERGEN PROFILE, MOLD
Allergen, A. alternata, m6: <0.1 kU/L
Allergen, Mucor Racemosus, M4: <0.1 kU/L
Allergen, P. notatum, m1: <0.1 kU/L
Allergen, S. Botryosum, m10: <0.1 kU/L
Aspergillus fumigatus, m3: <0.1 kU/L
CLADOSPORIUM HERBARUM (M2) IGE: <0.1 kU/L
CLASS: 0
Class: 0
Class: 0
Class: 0
Class: 0
Class: 0

## 2021-10-23 LAB — INTERPRETATION:

## 2021-10-24 ENCOUNTER — Encounter: Payer: Self-pay | Admitting: Adult Health

## 2021-10-24 NOTE — Telephone Encounter (Signed)
FYI

## 2022-03-13 IMAGING — CT CT MAXILLOFACIAL W/O CM
1 series · 15 of 30 positions shown, 19 images · non-contrast
Comparison: No pertinent prior exams available for comparison.

CLINICAL DATA: Recurrent sinusitis, post COVID.

EXAM:
CT MAXILLOFACIAL WITHOUT CONTRAST
TECHNIQUE: Multidetector CT images of the paranasal sinuses were obtained using
the standard protocol without intravenous contrast.

[Series 4: soft tissue · axial · 0.34mm/px · z∈[+1091,+1186]mm · 15 of 103 slices shown, 19 images]
[im 4/103  brain]
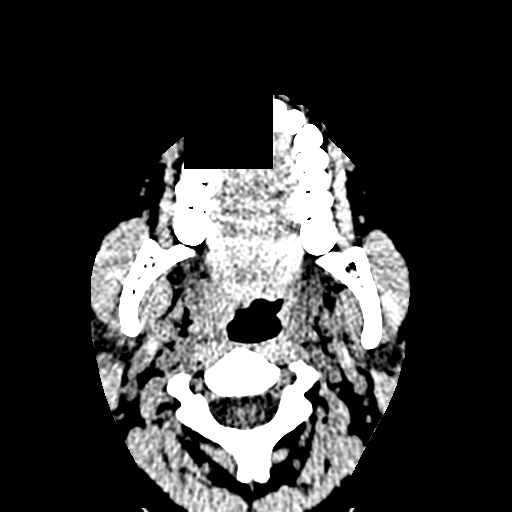
[im 4/103  bone]
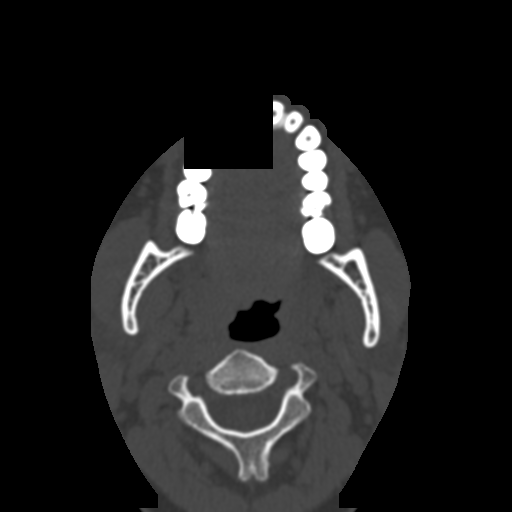
[im 11/103  bone]
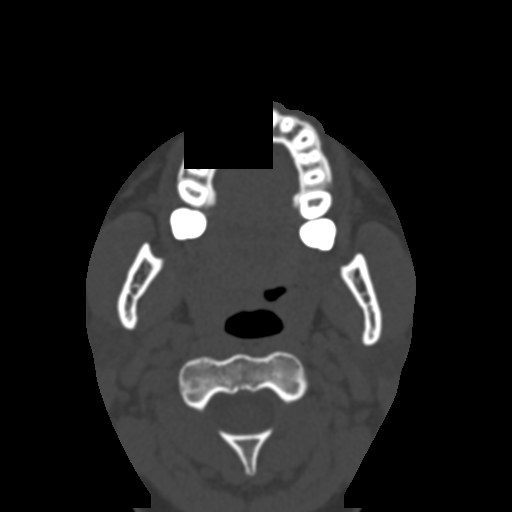
[im 18/103  bone]
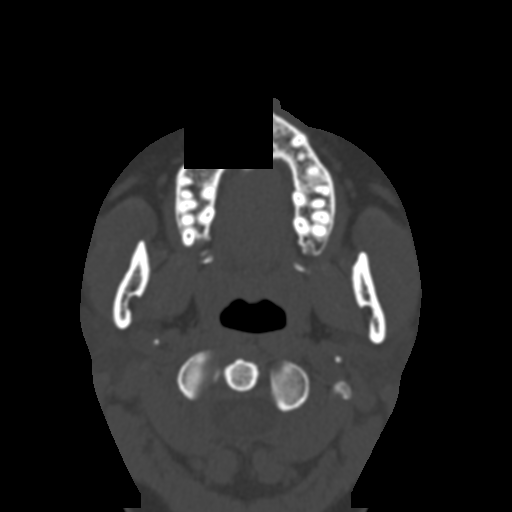
[im 25/103  bone]
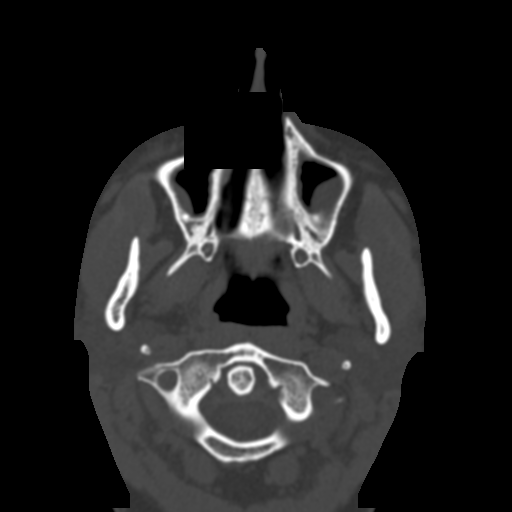
[im 32/103  brain]
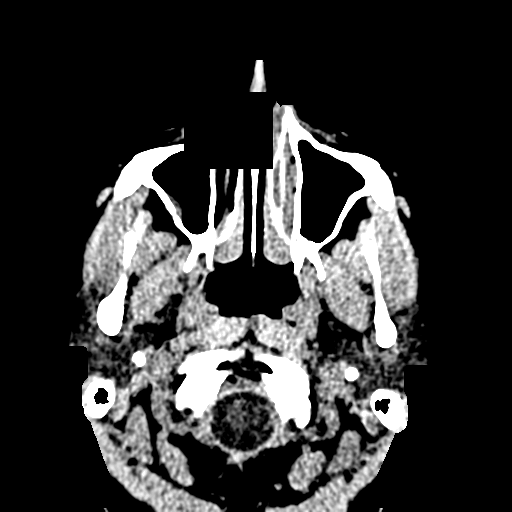
[im 32/103  bone]
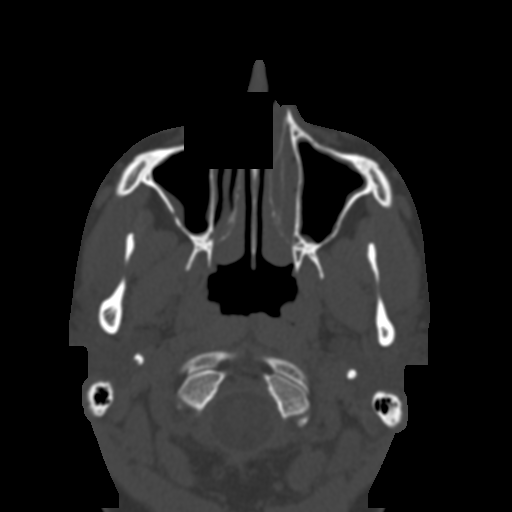
[im 39/103  bone]
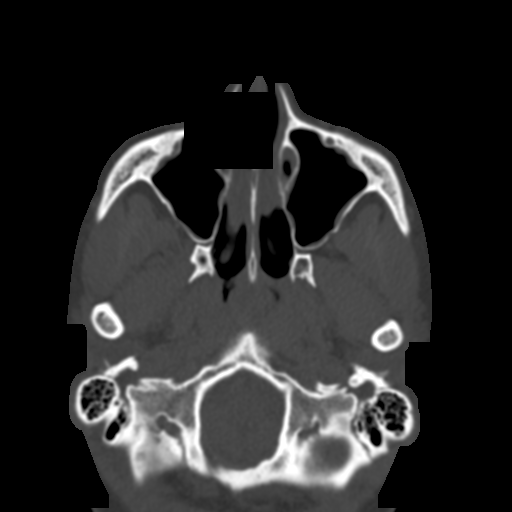
[im 46/103  bone]
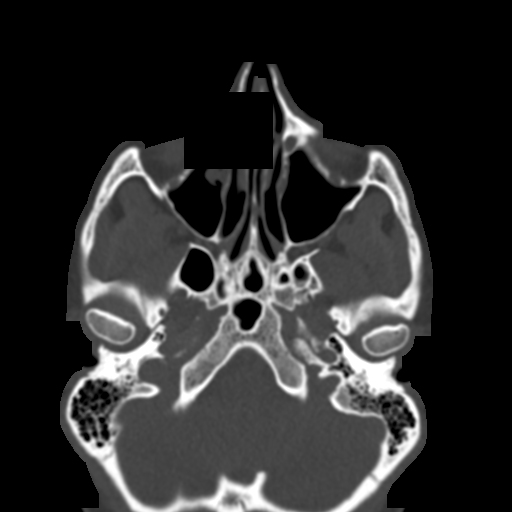
[im 53/103  bone]
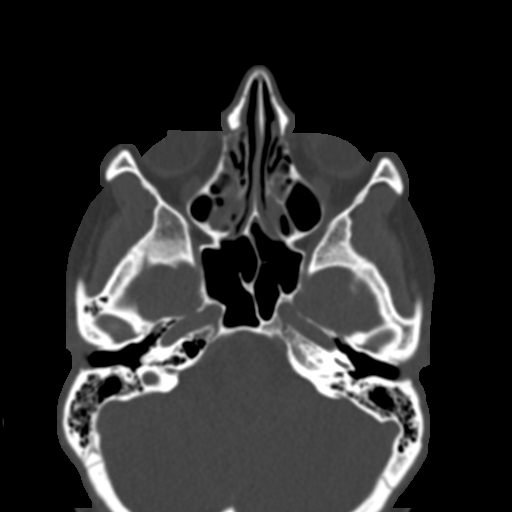
[im 57/103  brain]
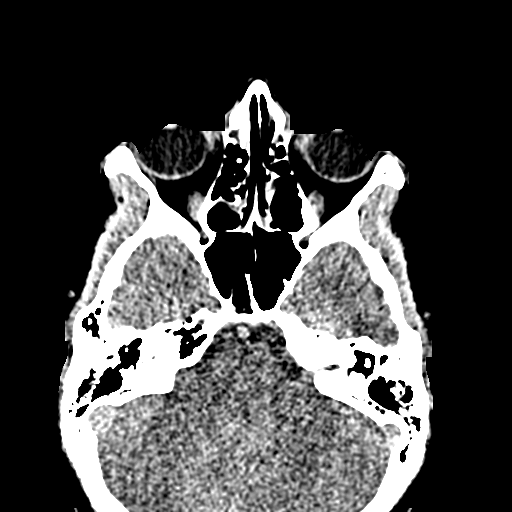
[im 57/103  bone]
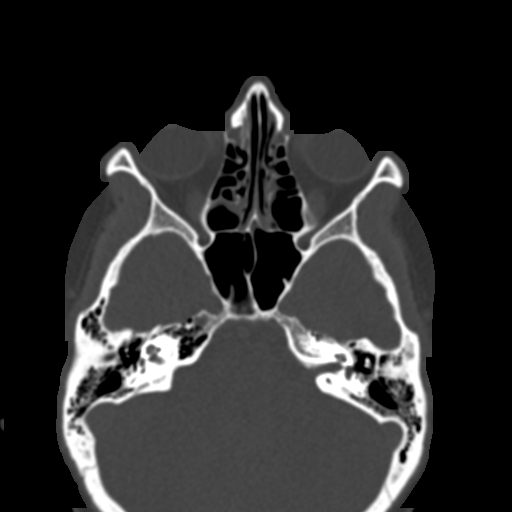
[im 64/103  bone]
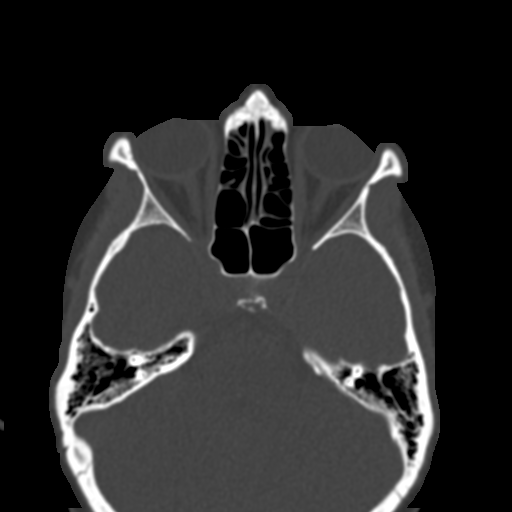
[im 71/103  bone]
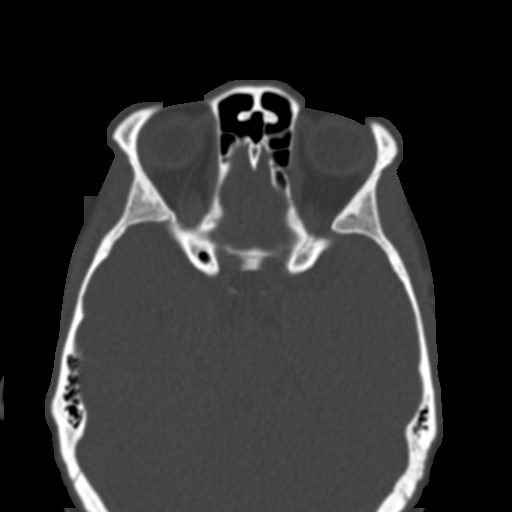
[im 78/103  bone]
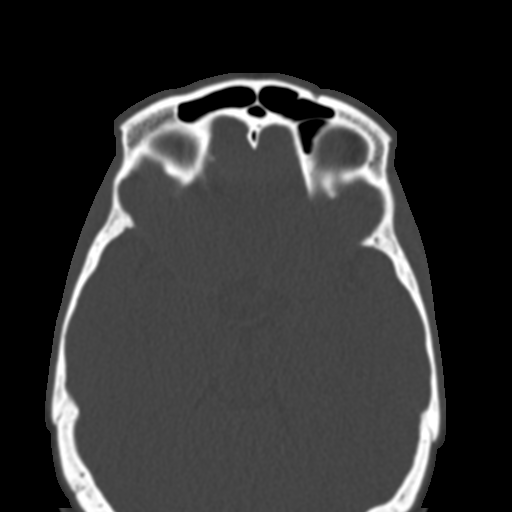
[im 85/103  brain]
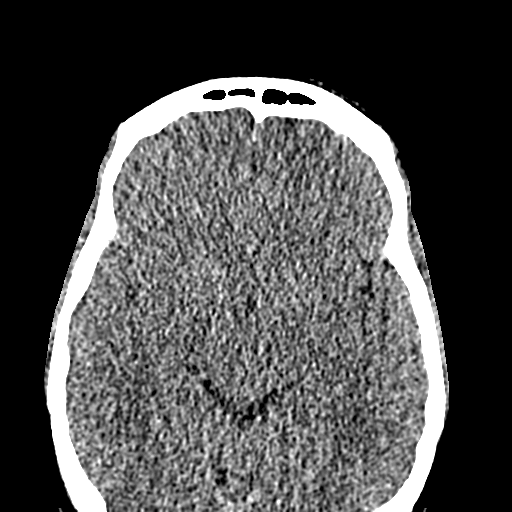
[im 85/103  bone]
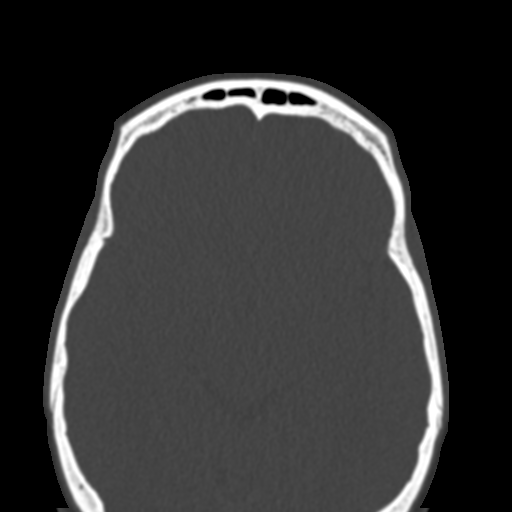
[im 92/103  bone]
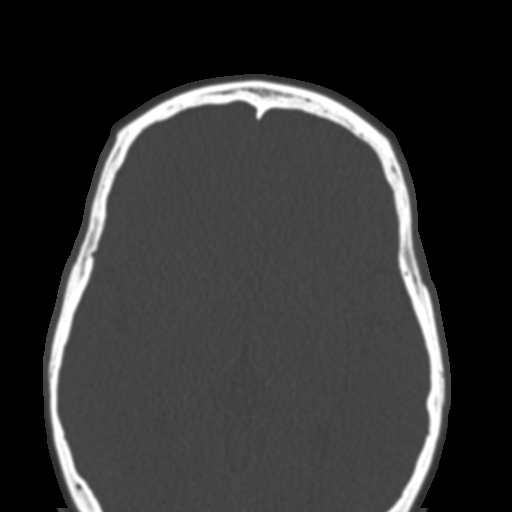
[im 99/103  bone]
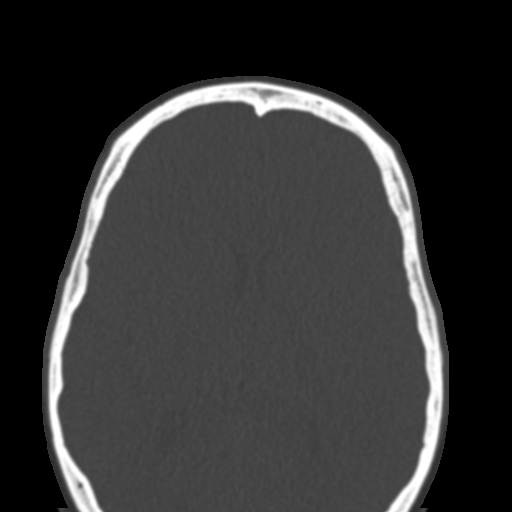

[15 of 30 positions shown; findings below may reference images not displayed]

FINDINGS: Paranasal sinuses:

Frontal: Mucosal thickening and fluid within the inferior frontal
sinuses and frontoethmoidal recesses, bilaterally. Both frontal
sinus drainage pathways are partly obstructed.

Ethmoid: There is otherwise mild scattered mucosal thickening within
the bilateral ethmoid air cells.

Maxillary: Mild mucosal thickening, and small-volume frothy
secretions, within the right maxillary sinus. Mild mucosal
thickening within the left maxillary sinus.

Sphenoid: Small-volume frothy secretions, and trace mucosal
thickening, within the right sphenoid sinus. Trace mucosal
thickening within the left maxillary sinus. The sphenoid sinus ostia
are partially opacified, bilaterally. Patent sphenoethmoidal
recesses.

Right ostiomeatal unit: The right maxillary sinus ostium and ethmoid
infundibulum are partially obstructed by frothy secretions and
mucosal thickening.

Left ostiomeatal unit: Patent.

Nasal passages: Slight leftward deviation of the nasal septum
anteriorly. Mild mucosal thickening, and possible fluid, within the
bilateral nasal passages.

Anatomy: No pneumatization superior to anterior ethmoid notches.
Symmetric and intact olfactory grooves and fovea ethmoidalis, Keros
I (1-3mm). Sellar sphenoid pneumatization pattern.
IMPRESSION: Paranasal sinus disease within the inferior frontal
sinuses/frontoethmoidal recesses, ethmoid sinuses, sphenoid sinuses
and maxillary sinuses bilaterally, as outlined. The bilateral
frontoethmoidal recesses, sphenoid sinus ostia and right ostiomeatal
unit are partially obstructed.

Slight leftward deviation of the nasal septum anteriorly.

Mild mucosal thickening, and possible fluid, within the bilateral
nasal passages.

## 2023-07-21 ENCOUNTER — Telehealth: Payer: Self-pay | Admitting: Adult Health

## 2023-07-21 NOTE — Telephone Encounter (Signed)
Lmom for pt to sch cpe or office visit it has been over a year
